# Patient Record
Sex: Female | Born: 1955 | Race: White | Hispanic: No | Smoking: Never smoker
Health system: Southern US, Community
[De-identification: ages and names within clinical notes are randomized; demographics above are authoritative.]

## PROBLEM LIST (undated history)

## (undated) DIAGNOSIS — I447 Left bundle-branch block, unspecified: Secondary | ICD-10-CM

## (undated) DIAGNOSIS — F4389 Other reactions to severe stress: Secondary | ICD-10-CM

## (undated) DIAGNOSIS — E785 Hyperlipidemia, unspecified: Secondary | ICD-10-CM

## (undated) DIAGNOSIS — F32A Depression, unspecified: Secondary | ICD-10-CM

## (undated) DIAGNOSIS — I10 Essential (primary) hypertension: Secondary | ICD-10-CM

## (undated) HISTORY — DX: Depression, unspecified: F32.A

## (undated) HISTORY — PX: NASAL SEPTUM SURGERY: SHX37

## (undated) HISTORY — DX: Other reactions to severe stress: F43.89

---

## 2003-12-28 ENCOUNTER — Ambulatory Visit: Payer: Self-pay | Admitting: Family Medicine

## 2004-03-04 ENCOUNTER — Ambulatory Visit: Payer: Self-pay | Admitting: Sports Medicine

## 2004-04-03 ENCOUNTER — Other Ambulatory Visit: Admission: RE | Admit: 2004-04-03 | Discharge: 2004-04-03 | Payer: Self-pay | Admitting: Gynecology

## 2004-11-29 ENCOUNTER — Ambulatory Visit: Payer: Self-pay | Admitting: Family Medicine

## 2004-12-17 ENCOUNTER — Ambulatory Visit: Payer: Self-pay | Admitting: Family Medicine

## 2005-05-19 ENCOUNTER — Other Ambulatory Visit: Admission: RE | Admit: 2005-05-19 | Discharge: 2005-05-19 | Payer: Self-pay | Admitting: *Deleted

## 2005-06-06 ENCOUNTER — Ambulatory Visit: Payer: Self-pay | Admitting: Family Medicine

## 2005-06-24 ENCOUNTER — Ambulatory Visit: Payer: Self-pay | Admitting: Family Medicine

## 2005-10-12 ENCOUNTER — Encounter (INDEPENDENT_AMBULATORY_CARE_PROVIDER_SITE_OTHER): Payer: Self-pay | Admitting: *Deleted

## 2005-10-12 LAB — CONVERTED CEMR LAB

## 2005-12-10 ENCOUNTER — Ambulatory Visit: Payer: Self-pay | Admitting: Family Medicine

## 2006-02-13 ENCOUNTER — Ambulatory Visit: Payer: Self-pay | Admitting: Family Medicine

## 2006-06-11 DIAGNOSIS — F329 Major depressive disorder, single episode, unspecified: Secondary | ICD-10-CM | POA: Insufficient documentation

## 2006-06-11 DIAGNOSIS — M545 Low back pain, unspecified: Secondary | ICD-10-CM | POA: Insufficient documentation

## 2006-06-11 DIAGNOSIS — N951 Menopausal and female climacteric states: Secondary | ICD-10-CM | POA: Insufficient documentation

## 2006-06-11 DIAGNOSIS — K5900 Constipation, unspecified: Secondary | ICD-10-CM | POA: Insufficient documentation

## 2006-06-11 DIAGNOSIS — F411 Generalized anxiety disorder: Secondary | ICD-10-CM | POA: Insufficient documentation

## 2006-06-12 ENCOUNTER — Encounter (INDEPENDENT_AMBULATORY_CARE_PROVIDER_SITE_OTHER): Payer: Self-pay | Admitting: *Deleted

## 2006-07-01 ENCOUNTER — Telehealth (INDEPENDENT_AMBULATORY_CARE_PROVIDER_SITE_OTHER): Payer: Self-pay | Admitting: Family Medicine

## 2007-02-02 ENCOUNTER — Encounter (INDEPENDENT_AMBULATORY_CARE_PROVIDER_SITE_OTHER): Payer: Self-pay | Admitting: Family Medicine

## 2007-03-31 ENCOUNTER — Ambulatory Visit: Payer: Self-pay | Admitting: Family Medicine

## 2007-03-31 ENCOUNTER — Encounter (INDEPENDENT_AMBULATORY_CARE_PROVIDER_SITE_OTHER): Payer: Self-pay | Admitting: Family Medicine

## 2007-03-31 DIAGNOSIS — R5383 Other fatigue: Secondary | ICD-10-CM

## 2007-03-31 DIAGNOSIS — R5381 Other malaise: Secondary | ICD-10-CM | POA: Insufficient documentation

## 2007-04-01 ENCOUNTER — Encounter (INDEPENDENT_AMBULATORY_CARE_PROVIDER_SITE_OTHER): Payer: Self-pay | Admitting: *Deleted

## 2007-04-01 LAB — CONVERTED CEMR LAB
CO2: 23 meq/L (ref 19–32)
Calcium: 9.5 mg/dL (ref 8.4–10.5)
Chloride: 106 meq/L (ref 96–112)
Glucose, Bld: 82 mg/dL (ref 70–99)
MCV: 94.1 fL (ref 78.0–100.0)
Potassium: 4.4 meq/L (ref 3.5–5.3)
RBC: 4.57 M/uL (ref 3.87–5.11)
Sodium: 140 meq/L (ref 135–145)
WBC: 5.7 10*3/uL (ref 4.0–10.5)

## 2007-04-20 ENCOUNTER — Telehealth: Payer: Self-pay | Admitting: *Deleted

## 2007-07-20 ENCOUNTER — Ambulatory Visit: Payer: Self-pay | Admitting: Family Medicine

## 2007-07-20 ENCOUNTER — Telehealth: Payer: Self-pay | Admitting: *Deleted

## 2007-07-20 DIAGNOSIS — R519 Headache, unspecified: Secondary | ICD-10-CM | POA: Insufficient documentation

## 2007-07-20 DIAGNOSIS — R51 Headache: Secondary | ICD-10-CM | POA: Insufficient documentation

## 2008-11-03 ENCOUNTER — Telehealth: Payer: Self-pay | Admitting: Family Medicine

## 2008-11-27 ENCOUNTER — Telehealth: Payer: Self-pay | Admitting: Family Medicine

## 2008-11-28 ENCOUNTER — Ambulatory Visit: Payer: Self-pay | Admitting: Family Medicine

## 2008-11-28 ENCOUNTER — Encounter: Payer: Self-pay | Admitting: Family Medicine

## 2008-11-28 DIAGNOSIS — R413 Other amnesia: Secondary | ICD-10-CM | POA: Insufficient documentation

## 2008-11-28 LAB — CONVERTED CEMR LAB
TSH: 0.738 microintl units/mL (ref 0.350–4.500)
Vitamin B-12: 509 pg/mL (ref 211–911)

## 2008-11-29 ENCOUNTER — Telehealth: Payer: Self-pay | Admitting: Family Medicine

## 2009-01-25 ENCOUNTER — Ambulatory Visit: Payer: Self-pay | Admitting: Family Medicine

## 2009-08-10 ENCOUNTER — Emergency Department: Payer: Self-pay | Admitting: Unknown Physician Specialty

## 2009-12-07 ENCOUNTER — Telehealth: Payer: Self-pay | Admitting: Family Medicine

## 2009-12-27 ENCOUNTER — Ambulatory Visit: Payer: Self-pay | Admitting: Family Medicine

## 2010-03-22 ENCOUNTER — Ambulatory Visit: Payer: Self-pay | Admitting: Family Medicine

## 2010-03-22 DIAGNOSIS — E039 Hypothyroidism, unspecified: Secondary | ICD-10-CM | POA: Insufficient documentation

## 2010-03-22 DIAGNOSIS — L259 Unspecified contact dermatitis, unspecified cause: Secondary | ICD-10-CM | POA: Insufficient documentation

## 2010-03-25 ENCOUNTER — Encounter: Payer: Self-pay | Admitting: Family Medicine

## 2010-05-14 NOTE — Progress Notes (Signed)
Summary: rx refill  Phone Note Call from Patient Call back at Work Phone [phone]   Caller: Patient Summary of Call: pt wants an Rx for Klonipin. her mother passed away a few mos ago and she has had to put her daughter in behavioral health and she has been pulling her hair out please call her back at work Initial call taken by: Loralee Pacas CMA,  December 07, 2009 9:18 AM  Follow-up for Phone Call        Please have pt schedule an office visit to discuss.  I have not seen her in nearly a year. Follow-up by: Angelena Sole MD,  December 07, 2009 9:36 AM  Additional Follow-up for Phone Call Additional follow up Details #1::        That was pts work number.  LM with secretary to have pt call back Additional Follow-up by: Jone Baseman CMA,  December 07, 2009 9:56 AM    Additional Follow-up for Phone Call Additional follow up Details #2::    she said that she can't make appt now since she has no money.  I told her that if it gets bad enough to call back and we will make appt. Follow-up by: De Nurse,  December 07, 2009 10:35 AM

## 2010-05-14 NOTE — Assessment & Plan Note (Signed)
Summary: depression and anxiety   Vital Signs:  Patient profile:   55 year old female Height:      63.5 inches Weight:      123.7 pounds BMI:     21.65 Temp:     98.8 degrees F oral Pulse rate:   74 / minute BP sitting:   121 / 68  (left arm) Cuff size:   regular  Vitals Entered By: Garen Grams LPN (December 27, 2009 2:38 PM) CC: stress Is Patient Diabetic? No Pain Assessment Patient in pain? no        Primary Care Provider:  Angelena Sole MD  CC:  stress.  History of Present Illness: 55 yo female here with itching problem.  Pt states that she has been itching her scalp much more frequently but not really from being itchy, she actually thinks it is due to her anxiety level.  Pt states that she is having a lot of problems with her daughter and has recently loss her mother and this is near the time she lost her husband in 2007.  There are a lot of family dynamics and she is having problems dealing with it.  Pt states that she has times where she feels like she just wants to cry and is overwhelming and it is interfereing with her work now.  Pt states she is not taking time for herself and not able to do the things she usually likes to do.  Pt denies SI, HI, pt denies any substance abuse problems.  Pt used to be on celexa and many other anti-depression medicines but none of them seemed to work.  Pt denies any feelings of granduosity or where she feels invincible.  No bipolar runs in the family.  Pt is not taking any medications at this time. except for some left over klonopin whcih seems to help a little.   Per the scratching pt has not tried to change shampoos no new pets nothing new in her environment, pt has had this problem before when her depression gets bad.   Habits & Providers  Alcohol-Tobacco-Diet     Tobacco Status: never  Current Medications (verified): 1)  Klonopin 0.5 Mg Tabs (Clonazepam) .... Take 1 Tablet By Mouth Three Times A Day  Allergies (verified): No  Known Drug Allergies  Past History:  Past medical, surgical, family and social histories (including risk factors) reviewed, and no changes noted (except as noted below).  Past Medical History: Reviewed history from 01/25/2009 and no changes required. Menopause 07 Depression Anxiety Fatigue  Family History: Reviewed history from 06/11/2006 and no changes required. Father-none, no psych hx, Mother- none, no psych hx, hx of polyps with colecctomy.  Social History: Reviewed history from 03/31/2007 and no changes required. 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  Was a disabled, amputee, 3 Children , daughter 33 with ADHD, daugher 66- nurse at [employer], recent questionable social hx, son 55.  Financial stress at home.; Nonsmoker, nonalcoholic, no sexual activity in 11 yrs.  Review of Systems       denies hair loss, weight changes SOb, chest pain or fatigue.   Physical Exam  General:  alert, well-developed, and well-hydrated.   Head:  scalp has some mild area of exfoliation but no real rash or dandruff seen, does not appear to have much skin breakdown and areas where there is seem self inflicted.  Eyes:  vision grossly intact, pupils equal, pupils round, and pupils reactive to light.  Mouth:  MMM Lungs:  normal respiratory effort, no crackles, and no wheezes.   Heart:  normal rate, regular rhythm, and no murmur.   Abdomen:  soft, non-tender, normal bowel sounds, and no distention.   Pulses:  2+ Extremities:  no lower extremity edema Psych:   anxious appearing, full affect, interactive,  avoids eye contact sometimes when talking about problems but not tearful.   Impression & Recommendations:  Problem # 1:  DEPRESSIVE DISORDER, NOS (ICD-311) Pt is depressed, PHQ 9 has high score, no SI, HI, pt states she would not take any medication that would cause weight gain or make her tired.  Had not tried wellbutrin so will start and titrate up after fourdays.   Pt given side effects to look for and red flags on when to stop.  Pt verbalized understanding.  Pt given Dr. Carola Rhine card and told to call if feel that this would be helpful.  Pt to follow up in 2-3 weeks with PCP.  The following medications were removed from the medication list:    Cymbalta 60 Mg Cpep (Duloxetine hcl) .Marland Kitchen... Take 1 tab daily Her updated medication list for this problem includes:    Klonopin 0.5 Mg Tabs (Clonazepam) .Marland Kitchen... Take 1 tablet by mouth three times a day  Orders: [facility]- Est  Level 4 (54270)  Problem # 2:  ANXIETY (ICD-300.00) Pt given klonopin again told that this is a band aid and would like for her to titrate down at a later date, hopefully starting in the next month, pt states up to this last week had not had any for the last month.  It shows pt can be with out it.   Pt has never flet addicted to medicine.  Plan on changing her to two times a day dosing at next visit.  The following medications were removed from the medication list:    Cymbalta 60 Mg Cpep (Duloxetine hcl) .Marland Kitchen... Take 1 tab daily Her updated medication list for this problem includes:    Klonopin 0.5 Mg Tabs (Clonazepam) .Marland Kitchen... Take 1 tablet by mouth three times a day  Orders: [facility]- Est  Level 4 (99214)  Complete Medication List: 1)  Klonopin 0.5 Mg Tabs (Clonazepam) .... Take 1 tablet by mouth three times a day 2)  Buproban 150 Mg Xr12h-tab (Bupropion hcl (smoking deter)) .Marland Kitchen.. 1 tab by mouth daily for 1st 4 days then 2 tabs by mouth once daily  Patient Instructions: 1)  Nice to meet you 2)  We will start wellbutrin today.  Take 1 tab daily for 4 days then 2 tabs once daily  3)  I will give you 3 weeks worth of klonopin to help 4)  I want you to see me or Dr. Lelon Perla in 2-3 weeks to make sure you are doing better.  5)  If you ever need anything or feel you may be a harm to yourself please call immediately.  Prescriptions: BUPROBAN 150 MG XR12H-TAB (BUPROPION HCL (SMOKING DETER)) 1 tab by mouth daily  for 1st 4 days then 2 tabs by mouth once daily  #62 x 0   Entered and Authorized by:   Antoine Primas DO   Signed by:   Antoine Primas DO on 12/27/2009   Method used:   Print then Give to Patient   RxID:   [id number] KLONOPIN 0.5 MG TABS (CLONAZEPAM) Take 1 tablet by mouth three times a day  #90 x 0   Entered and Authorized by:   Antoine Primas DO  Signed by:   Antoine Primas DO on 12/27/2009   Method used:   Print then Give to Patient   RxID:   [id number]

## 2010-05-14 NOTE — Assessment & Plan Note (Signed)
Summary: f/u,df   Vital Signs:  Patient profile:   55 year old female Height:      63.5 inches Weight:      112 pounds Temp:     98.0 degrees F oral BP sitting:   120 / 68  (left arm) Cuff size:   regular  Vitals Entered By: Tessie Fass CMA (March 22, 2010 8:52 AM) CC: hypothryoidism, menopause, depression, itchy scalp   Primary Care Provider:  Angelena Sole MD  CC:  hypothryoidism, menopause, depression, and itchy scalp.  History of Present Illness: 1. Hypothyroidism:  Pt was seen by another provider who started her on thyroid replacement to see if that would help her with her fatigue and mood.  She was started on a lose dose of Armour Thyroid.  This may have helped her with her energy a little bit but she is unsure.  She has been out of it for about 1 month and hasn't noticed much of a difference but she would like to try it again.  ROS: endorses thin hair,  denies diarrhea, constipation, endorses dry skin.  denies weight gain  2. Menopause:  Pt had her hormone levels tested by another provider that showed that she had really low Progesterone.  She was started on oral progesterone but only took it a couple of times because it was too expensive.   ROS: endorses occassional hot flashes, mood lability  3. Depression / Anxiety:  She has been tried on different anti-depressants and some natural alternatives like Deplin but didn't seem to make much of a difference.  She seems to have constant stress with her family.  She had to admit her daughter to behavioural health.  She feels that her mood is fairly stable right now but she would like to feel better.  Her anxiety is fairly well controlled but she is also having to take Klonopin on occassion when she gets really anxious.  She still has some of those left.  ROS: denies SI, manic symptoms  4. Itchy scalp:  She has had itchy scalp for months.  Seems to be worse in the winter months.  She has tried different shampoos without much  benefit.  ROS: denies hair loss  Current Medications (verified): 1)  Klonopin 0.5 Mg Tabs (Clonazepam) .... Take 1 Tablet By Mouth Three Times A Day 2)  Armour Thyroid 15 Mg Tabs (Thyroid) .... Take 1 Tab By Mouth Daily 3)  Hrt Base Natural  Crea (Hormone Cream Base) .... Progesterone Cream.  Apply As Directed 4)  Fish Oil 1000 Mg Caps (Omega-3 Fatty Acids) .... 2 Tabs By Mouth Daily  Allergies: No Known Drug Allergies  Past History:  Past Medical History: Reviewed history from 01/25/2009 and no changes required. Menopause 07 Depression Anxiety Fatigue  Social History: Reviewed history from 03/31/2007 and no changes required. 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  Was a disabled, amputee, 3 Children , daughter 49 with ADHD, daugher 75- nurse at [employer], recent questionable social hx, son 29.  Financial stress at home.; Nonsmoker, nonalcoholic, no sexual activity in 11 yrs.  Physical Exam  General:  vitals reviewed. alert, well-developed, and well-hydrated.  no acute distress. Head:  scalp dry with some areas of excoriation Eyes:  vision grossly intact.  no exopthalmos Mouth:  good dentition and pharynx pink and moist.   Neck:  supple, full ROM, and no masses.  no thyroidmegaly Lungs:  normal respiratory effort, normal breath sounds, no crackles,  and no wheezes.   Heart:  normal rate, regular rhythm, and no murmur.   Abdomen:  soft, non-tender, and normal bowel sounds.   Msk:  no joint tenderness and no joint swelling.   Extremities:  no LE edema Skin:  slightly dry skin Psych:  normally interactive, good eye contact, not anxious appearing, and not depressed appearing.   Additional Exam:  Labs reviewed from other provider: 1. TSH 1.97, T3 3.40, T4 0.9, RT3 22.5 2. Progesterone <0.03 3. Cortisol: overall normal curve 4. EFA: low Omega 3's 5. Micronutrients: multiple low   Impression & Recommendations:  Problem # 1:  HYPOTHYROIDISM,  BORDERLINE (ICD-244.9) Assessment New  Pt with slightly elevated TSH and low normal T3 and T4.  Will try Armour thyroid at low dose to see if that helps her with her energy.  Will recheck TSH, T3, T4 at next visit. Her updated medication list for this problem includes:    Armour Thyroid 15 Mg Tabs (Thyroid) .Marland Kitchen... Take 1 tab by mouth daily  Orders: [facility]- Est  Level 4 (59563)  Problem # 2:  MENOPAUSAL SYNDROME (ICD-627.2) Assessment: Unchanged  Still with some symptoms.  Labs indicate very low progesteron.  Recommended OTC progesterone cream.  Orders: [facility]- Est  Level 4 (87564)  Problem # 3:  DEPRESSIVE DISORDER, NOS (ICD-311) Assessment: Unchanged  Mood stable.  Will see how she does with some thyroid and progesterone.  Continue to monitor. Her updated medication list for this problem includes:    Klonopin 0.5 Mg Tabs (Clonazepam) .Marland Kitchen... Take 1 tablet by mouth three times a day  Orders: [facility]- Est  Level 4 (33295)  Problem # 4:  ANXIETY (ICD-300.00) Assessment: Unchanged  Mood stable.  Will see how she does with the progesterone.  Continue to monitor. Her updated medication list for this problem includes:    Klonopin 0.5 Mg Tabs (Clonazepam) .Marland Kitchen... Take 1 tablet by mouth three times a day  Orders: [facility]- Est  Level 4 (18841)  Problem # 5:  DERMATITIS, SCALP (ICD-692.9) Assessment: New  Looks like dry skin.  Will try Hydrocortisone.  Also advised her to start taking Fish Oil.  Her updated medication list for this problem includes:    Ala Scalp 2 % Lotn (Hydrocortisone) .Marland Kitchen... Apply small amount to itchy scalp twice a day  Orders: [facility]- Est  Level 4 (99214)  Complete Medication List: 1)  Klonopin 0.5 Mg Tabs (Clonazepam) .... Take 1 tablet by mouth three times a day 2)  Armour Thyroid 15 Mg Tabs (Thyroid) .... Take 1 tab by mouth daily 3)  Hrt Base Natural Crea (Hormone cream base) .... Progesterone cream.  apply as directed 4)  Fish Oil 1000 Mg Caps (Omega-3 fatty acids) ....  2 tabs by mouth daily 5)  Ala Scalp 2 % Lotn (Hydrocortisone) .... Apply small amount to itchy scalp twice a day  Patient Instructions: 1)  We will continue the Armour thyroid at a low dose to see if that helps 2)  I also want you to start taking Progesteron cream that you can get over the counter 3)  Start taking Fish oil 4)  We will try Hydrocortisone for your scalp 5)  Please schedule a follow up appointment in 2 months and we will recheck your thyroid levels Prescriptions: ALA SCALP 2 % LOTN (HYDROCORTISONE) Apply small amount to itchy scalp twice a day  #1 x 3   Entered and Authorized by:   Angelena Sole MD   Signed by:   Angelena Sole  MD on 03/22/2010   Method used:   Electronically to        [facility] (retail)       [address]       [place], [place]  [address]       Ph: [phone] or [phone]       Fax: [phone]   RxID:   [id number] HRT BASE NATURAL  CREA (HORMONE CREAM BASE) Progesterone cream.  Apply as directed  #1 x 3   Entered and Authorized by:   Angelena Sole MD   Signed by:   Angelena Sole MD on 03/22/2010   Method used:   [facility]        [id number] (retail[facility]       [address]       [place], [place]  [address]       Ph: [phone] or [phone]       Fax: [phone]   RxID:   [id number] ARMOUR THYROID 15 MG TABS (THYROID) Take 1 tab by mouth daily  #30 x 3   Entered and Authorized by:   Angelena Sole MD   Signed by:   Angelena Sole MD on 03/22/2010   Method used:   [facility]        [id number]       [address]       [place], [place]  [address]       Ph: [phone] or [phone]       Fax: [phone]   RxID:   [id number]    Orders Added: 1)  [facility]- Est  Level 4 [93810]

## 2010-05-16 NOTE — Miscellaneous (Signed)
Summary: HPA results  Clinical Lists Changes  [facility] HPA results Date 05/27/09  Cortisol: -am: 7.0 -noon: 3.2 -afternoon: 2.9 (H) -evening: 1.2  DHEAs -am: 1.2 -pm: 0.9 (L)  Serotonin: 140.6 (L) GABA: 184.6 (L) Dopamine: 198.8 NE: 36.3 Epi: 5.0 Glutamate: 31.7  N/E ratio: 7.2

## 2010-06-26 ENCOUNTER — Ambulatory Visit (INDEPENDENT_AMBULATORY_CARE_PROVIDER_SITE_OTHER): Payer: PRIVATE HEALTH INSURANCE | Admitting: Family Medicine

## 2010-06-26 ENCOUNTER — Encounter: Payer: Self-pay | Admitting: Family Medicine

## 2010-06-26 VITALS — BP 139/82 | HR 87 | Temp 98.8°F | Wt 104.2 lb

## 2010-06-26 DIAGNOSIS — D1739 Benign lipomatous neoplasm of skin and subcutaneous tissue of other sites: Secondary | ICD-10-CM

## 2010-06-26 DIAGNOSIS — E039 Hypothyroidism, unspecified: Secondary | ICD-10-CM

## 2010-06-26 DIAGNOSIS — D170 Benign lipomatous neoplasm of skin and subcutaneous tissue of head, face and neck: Secondary | ICD-10-CM | POA: Insufficient documentation

## 2010-06-26 DIAGNOSIS — J329 Chronic sinusitis, unspecified: Secondary | ICD-10-CM

## 2010-06-26 LAB — TSH: TSH: 0.944 u[IU]/mL (ref 0.350–4.500)

## 2010-06-26 MED ORDER — AMOXICILLIN 500 MG PO CAPS: 500.0000 mg | ORAL_CAPSULE | Freq: Three times a day (TID) | ORAL | Status: AC

## 2010-06-26 NOTE — Assessment & Plan Note (Signed)
Sounds like chronic sinusitis.  She has a hx of sinus infections and has required abx before.  Will treat with 10 day course of Amoxicillin.  No signs of serious infection.  No fevers

## 2010-06-26 NOTE — Assessment & Plan Note (Signed)
Small lipoma, should be easy to remove.  Will have her schedule a procedure visit.

## 2010-06-26 NOTE — Progress Notes (Signed)
  Subjective:    Patient ID: Kristi Clay, female    DOB: 1955-09-05, 55 y.o.   MRN: [id number]  HPI 1. Sinusitis:  She has had sinusitis type symptoms for the past couple of months.  It started with viral URI with cough, congestion.  The cough improved but the sinus congestion has persisted.  She continues to have rhinnorhea.  She has also started to have headaches.  2. Hypothyroid:  She has been taking the Armour as directed.  She hasn't noticed much of a difference in her energy level.  3. Bump on her neck:  She noticed a small bump on the back of her neck a couple of days ago.  It is not tender, red, or swollen.  It is not getting bigger or smaller.   Review of Systems  Constitutional: Positive for fatigue. Negative for fever, activity change and unexpected weight change.  HENT: Positive for congestion and rhinorrhea. Negative for ear pain, sneezing, neck pain, neck stiffness and postnasal drip.   Eyes: Negative for pain.  Respiratory: Positive for cough. Negative for shortness of breath, wheezing and stridor.   Cardiovascular: Negative for chest pain, palpitations and leg swelling.  Gastrointestinal: Negative for abdominal distention.  Neurological: Positive for headaches. Negative for weakness, light-headedness and numbness.  Hematological: Negative for adenopathy.       Objective:   Physical Exam  Constitutional: She appears well-nourished. No distress.  HENT:  Head: Normocephalic and atraumatic.       TMs normal bilaterally OP pink and moist, no exudate Sinuses non-tender + rhinorrhea  Neck: Normal range of motion. Neck supple. No thyromegaly present.       Small (<1cm) lipoma on the back of her neck  Cardiovascular: Normal rate and regular rhythm.   Pulmonary/Chest: Effort normal and breath sounds normal. No respiratory distress. She has no wheezes. She has no rales.  Abdominal: Soft.  Musculoskeletal: She exhibits no edema.  Skin: Skin is warm and dry. No rash  noted. She is not diaphoretic. No erythema.          Assessment & Plan:

## 2010-06-26 NOTE — Patient Instructions (Signed)
It was good to see you today I think that you have a persistent sinusitis I am going to treat you with a 10 day course of Amoxicillin We will check your thyroid function today and will let you know of the results I think that the bump on your neck is a small lipoma.  Please schedule an appointment that is convenient to take it out.

## 2010-06-26 NOTE — Assessment & Plan Note (Signed)
She hasn't noticed much of a difference in her energy.  Will check TSH again today.  If she can tolerate it, may consider increasing the dose slightly.

## 2010-06-27 ENCOUNTER — Encounter: Payer: Self-pay | Admitting: Family Medicine

## 2010-06-27 ENCOUNTER — Other Ambulatory Visit: Payer: Self-pay | Admitting: Family Medicine

## 2010-07-16 ENCOUNTER — Telehealth: Payer: Self-pay | Admitting: Family Medicine

## 2010-07-16 NOTE — Telephone Encounter (Signed)
She is worse than before last visit. Told her to stop the Afrin. Told her it can have a rebound effect. She will make an appt to be seen again

## 2010-07-16 NOTE — Telephone Encounter (Signed)
Patient calling back to say she is more congested than when she came for appt.  Antibiotic helped some for infection and headaches, but need something else called in for nasal congestion.  Using Afrin, but may have used longer than prescribed,so now need extra help for that.  Please advise if nothing can be called to what can be done for relief.

## 2010-07-17 ENCOUNTER — Encounter: Payer: Self-pay | Admitting: Family Medicine

## 2010-07-17 ENCOUNTER — Ambulatory Visit (INDEPENDENT_AMBULATORY_CARE_PROVIDER_SITE_OTHER): Payer: PRIVATE HEALTH INSURANCE | Admitting: Family Medicine

## 2010-07-17 VITALS — BP 138/80 | HR 70 | Temp 97.6°F | Wt 107.0 lb

## 2010-07-17 DIAGNOSIS — J329 Chronic sinusitis, unspecified: Secondary | ICD-10-CM

## 2010-07-17 MED ORDER — FLUTICASONE PROPIONATE 50 MCG/ACT NA SUSP: 2.0000 | Freq: Every day | NASAL | Status: DC

## 2010-07-17 NOTE — Progress Notes (Signed)
  Subjective:    Patient ID: Kristi Clay, female    DOB: 01-23-1956, 55 y.o.   MRN: [id number]  HPI  1) Nasal congestion / rhinorrhea: Seen on 06/26/10 by Dr. Lelon Perla, diagnosed with sinusitis, started on amoxicillin. She has completed the course of antibiotics and reports that sinus pressure has improved, but she has continued to have rhinorrhea and congestion and mild cough. Denies fever, chills, nausea, emesis, diarrhea, rash, wheeze, dyspnea, chest pain, itchy eyes, ear pain, fatigue, headache.   Review of Systems As per HPI otherwise negative    Objective:   Physical Exam General: Well appearing, NAD  Head: No sinus tenderness to palpation Ears: TM normal bilaterally Eyes: No conjunctival injection or tearing  Nose: +ve congestion and rhinorrhea, pale turbinates  Mouth: no erythema, or cobblestoning or exudate  Neck: no LAD  Pulmonary/Chest: Effort normal and breath sounds normal. No respiratory distress. She has no wheezes. She has no rales.         Assessment & Plan:

## 2010-07-17 NOTE — Assessment & Plan Note (Signed)
Possibly allergic rhinitis though patient denies history of allergies. Will try Flonase today, advised regarding non-sedating OTC anti-histamines. Follow up prn. Handout given.

## 2010-07-17 NOTE — Patient Instructions (Signed)
Stop using the Afrin - this may be contributing to your symptoms. Start using Flonase as directed. You may also try over the counter Zyrtec, Allegra or Claritin to help with symptoms.  Follow up with Dr. Lelon Perla as needed.  Have a great day!  - Dr. Wallene Huh     Sinusitis Sinuses are air pockets within the bones of your face. The growth of bacteria within a sinus leads to infection. Infection keeps the sinuses from draining. This infection is called sinusitis. SYMPTOMS There will be different areas of pain depending on which sinuses have become infected.  The maxillary sinuses often produce pain beneath the eyes.   Frontal sinusitis may cause pain in the middle of the forehead and above the eyes.  Other problems (symptoms) include:  Toothaches.   Colored, pus-like (purulent) drainage from the nose.   Any swelling, warmth, or tenderness over the sinus areas may be signs of infection.  TREATMENT Sinusitis is most often determined by an exam and you may have x-rays taken. If x-rays have been taken, make sure you obtain your results. Or find out how you are to obtain them. Your caregiver may give you medications (antibiotics). These are medications that will help kill the infection. You may also be given a medication (decongestant) that helps to reduce sinus swelling.  HOME CARE INSTRUCTIONS  Only take over-the-counter or prescription medicines for pain, discomfort, or fever as directed by your caregiver.   Drink extra fluids. Fluids help thin the mucus so your sinuses can drain more easily.   Applying either moist heat or ice packs to the sinus areas may help relieve discomfort.   Use saline nasal sprays to help moisten your sinuses. The sprays can be found at your local drugstore.  SEEK IMMEDIATE MEDICAL CARE IF YOU DEVELOP:  High fever that is still present after two days of antibiotic treatment.   Increasing pain, severe headaches, or toothache.   Nausea, vomiting, or  drowsiness.   Unusual swelling around the face or trouble seeing.  MAKE SURE YOU:   Understand these instructions.   Will watch your condition.   Will get help right away if you are not doing well or get worse.  Document Released: 03/31/2005 Document Re-Released: 03/13/2008 [facility] Patient Information 2011 [place], [place].

## 2010-07-27 ENCOUNTER — Other Ambulatory Visit: Payer: Self-pay | Admitting: Family Medicine

## 2010-07-29 NOTE — Telephone Encounter (Signed)
Refill request

## 2010-11-30 ENCOUNTER — Other Ambulatory Visit: Payer: Self-pay | Admitting: Family Medicine

## 2010-12-01 NOTE — Telephone Encounter (Signed)
Refill request

## 2010-12-31 ENCOUNTER — Other Ambulatory Visit: Payer: Self-pay | Admitting: Family Medicine

## 2011-01-01 NOTE — Telephone Encounter (Signed)
Refill request

## 2011-12-11 ENCOUNTER — Other Ambulatory Visit: Payer: Self-pay | Admitting: Family Medicine

## 2012-02-10 ENCOUNTER — Other Ambulatory Visit: Payer: Self-pay | Admitting: Family Medicine

## 2012-07-31 ENCOUNTER — Other Ambulatory Visit: Payer: Self-pay | Admitting: Family Medicine

## 2014-01-26 ENCOUNTER — Ambulatory Visit: Payer: Self-pay | Admitting: Gastroenterology

## 2015-11-22 ENCOUNTER — Encounter (HOSPITAL_COMMUNITY): Payer: Self-pay | Admitting: Emergency Medicine

## 2015-11-22 ENCOUNTER — Ambulatory Visit (HOSPITAL_COMMUNITY)
Admission: EM | Admit: 2015-11-22 | Discharge: 2015-11-22 | Disposition: A | Discharge status: Home / Self Care | Payer: BLUE CROSS/BLUE SHIELD | Attending: Family Medicine | Admitting: Family Medicine

## 2015-11-22 DIAGNOSIS — N390 Urinary tract infection, site not specified: Secondary | ICD-10-CM | POA: Diagnosis not present

## 2015-11-22 LAB — POCT URINALYSIS DIP (DEVICE)
Glucose, UA: NEGATIVE mg/dL
Ketones, ur: 15 mg/dL — AB
Leukocytes, UA: NEGATIVE
NITRITE: NEGATIVE
PH: 5.5 (ref 5.0–8.0)
Protein, ur: 30 mg/dL — AB
UROBILINOGEN UA: 1 mg/dL (ref 0.0–1.0)

## 2015-11-22 MED ORDER — CEPHALEXIN 500 MG PO CAPS: 500.0000 mg | ORAL_CAPSULE | Freq: Four times a day (QID) | ORAL | 0 refills | Status: DC

## 2015-11-22 NOTE — ED Provider Notes (Signed)
CSN: [id number]     Arrival date & time 11/22/15  1420 History   First MD Initiated Contact with Patient 11/22/15 1514     Chief Complaint  Patient presents with  . Urinary Tract Infection   (Consider location/radiation/quality/duration/timing/severity/associated sxs/prior Treatment) 60 year old female is complaining of urinary frequency, urgency and bladder discomfort for the past 24 hours. Symptoms are progressing. Denies actual pain with urination. Denies nausea, vomiting, fever or chills.      History reviewed. No pertinent past medical history. Past Surgical History:  Procedure Laterality Date  . NASAL SEPTUM SURGERY     No family history on file. Social History  Substance Use Topics  . Smoking status: Never Smoker  . Smokeless tobacco: Never Used  . Alcohol use Not on file   OB History    No data available     Review of Systems  Constitutional: Negative.   HENT: Negative.   Respiratory: Negative.   Gastrointestinal: Negative.   Genitourinary: Positive for frequency and urgency. Negative for dysuria and menstrual problem.       Small volume voids  All other systems reviewed and are negative.   Allergies  Review of patient's allergies indicates no known allergies.  Home Medications   Prior to Admission medications   Medication Sig Start Date End Date Taking? Authorizing Provider  ARMOUR THYROID 15 MG tablet TAKE 1 TABLET BY MOUTH ONCE DAILY 07/31/12   Boykin Nearing, MD  cephALEXin (KEFLEX) 500 MG capsule Take 1 capsule (500 mg total) by mouth 4 (four) times daily. 11/22/15   Janne Napoleon, NP  clonazePAM (KLONOPIN) 0.5 MG tablet Take 0.5 mg by mouth 3 (three) times daily as needed.      Historical Provider, MD  fish oil-omega-3 fatty acids 1000 MG capsule Take 2 g by mouth daily.      Historical Provider, MD  fluticasone (FLONASE) 50 MCG/ACT nasal spray 2 sprays by Nasal route daily. 07/17/10 07/17/11  Mariana Arn, MD  HYDROCORTISONE, TOPICAL, (ALA-SCALP) 2 % LOTN  Apply small amount to itchy scalp twice a day     Historical Provider, MD   Meds Ordered and Administered this Visit  Medications - No data to display  BP 146/86 (BP Location: Right Arm)   Pulse 92   Temp 98.9 F (37.2 C) (Oral)   Resp 16   SpO2 100%  No data found.   Physical Exam  Constitutional: She is oriented to person, place, and time. She appears well-developed and well-nourished. No distress.  Eyes: EOM are normal.  Neck: Normal range of motion. Neck supple.  Cardiovascular: Normal rate.   Pulmonary/Chest: Effort normal. No respiratory distress.  Musculoskeletal: She exhibits no edema.  Neurological: She is alert and oriented to person, place, and time. She exhibits normal muscle tone.  Skin: Skin is warm and dry.  Psychiatric: She has a normal mood and affect.  Nursing note and vitals reviewed.   Urgent Care Course   Clinical Course    Procedures (including critical care time)  Labs Review Labs Reviewed  POCT URINALYSIS DIP (DEVICE) - Abnormal; Notable for the following:       Result Value   Bilirubin Urine SMALL (*)    Ketones, ur 15 (*)    Hgb urine dipstick MODERATE (*)    Protein, ur 30 (*)    All other components within normal limits  URINE CULTURE   Results for orders placed or performed during the hospital encounter of 11/22/15  POCT urinalysis dip (device)  Result  Value Ref Range   Glucose, UA NEGATIVE NEGATIVE mg/dL   Bilirubin Urine SMALL (A) NEGATIVE   Ketones, ur 15 (A) NEGATIVE mg/dL   Specific Gravity, Urine >=1.030 1.005 - 1.030   Hgb urine dipstick MODERATE (A) NEGATIVE   pH 5.5 5.0 - 8.0   Protein, ur 30 (A) NEGATIVE mg/dL   Urobilinogen, UA 1.0 0.0 - 1.0 mg/dL   Nitrite NEGATIVE NEGATIVE   Leukocytes, UA NEGATIVE NEGATIVE     Imaging Review No results found.   Visual Acuity Review  Right Eye Distance:   Left Eye Distance:   Bilateral Distance:    Right Eye Near:   Left Eye Near:    Bilateral Near:         MDM    1. UTI (lower urinary tract infection)    Meds ordered this encounter  Medications  . cephALEXin (KEFLEX) 500 MG capsule    Sig: Take 1 capsule (500 mg total) by mouth 4 (four) times daily.    Dispense:  28 capsule    Refill:  0    Order Specific Question:   Supervising Provider    Answer:   Melony Overly [id number]   AZO standard as dir Plenty of water Urine culture pending.    Janne Napoleon, NP 11/22/15 1531

## 2015-11-22 NOTE — ED Notes (Signed)
Patient requested script to be called to a different pharmacy since she is still at work and not going home until after office hours.  Called in cephalexin to [facility].  Spoke to [employer] at this location.

## 2015-11-22 NOTE — ED Triage Notes (Signed)
Patient reports a 2 day history of frequent urination, burning and bladder pressure.  Denies fever, no new back pain

## 2015-11-23 LAB — URINE CULTURE: Culture: NO GROWTH

## 2017-05-08 DIAGNOSIS — H2513 Age-related nuclear cataract, bilateral: Secondary | ICD-10-CM | POA: Diagnosis not present

## 2017-05-22 DIAGNOSIS — H2511 Age-related nuclear cataract, right eye: Secondary | ICD-10-CM | POA: Diagnosis not present

## 2017-05-22 DIAGNOSIS — H25811 Combined forms of age-related cataract, right eye: Secondary | ICD-10-CM | POA: Diagnosis not present

## 2017-05-22 DIAGNOSIS — H52201 Unspecified astigmatism, right eye: Secondary | ICD-10-CM | POA: Diagnosis not present

## 2017-06-05 DIAGNOSIS — H2512 Age-related nuclear cataract, left eye: Secondary | ICD-10-CM | POA: Diagnosis not present

## 2017-06-05 DIAGNOSIS — H25812 Combined forms of age-related cataract, left eye: Secondary | ICD-10-CM | POA: Diagnosis not present

## 2017-06-05 DIAGNOSIS — H52202 Unspecified astigmatism, left eye: Secondary | ICD-10-CM | POA: Diagnosis not present

## 2017-06-25 DIAGNOSIS — H02413 Mechanical ptosis of bilateral eyelids: Secondary | ICD-10-CM | POA: Diagnosis not present

## 2017-06-29 DIAGNOSIS — H02413 Mechanical ptosis of bilateral eyelids: Secondary | ICD-10-CM | POA: Diagnosis not present

## 2017-07-17 DIAGNOSIS — Z1151 Encounter for screening for human papillomavirus (HPV): Secondary | ICD-10-CM | POA: Diagnosis not present

## 2017-07-17 DIAGNOSIS — Z01419 Encounter for gynecological examination (general) (routine) without abnormal findings: Secondary | ICD-10-CM | POA: Diagnosis not present

## 2017-12-03 DIAGNOSIS — I10 Essential (primary) hypertension: Secondary | ICD-10-CM | POA: Diagnosis not present

## 2019-01-08 ENCOUNTER — Emergency Department
Admission: EM | Admit: 2019-01-08 | Discharge: 2019-01-08 | Disposition: A | Discharge status: Home / Self Care | Payer: 59 | Attending: Emergency Medicine | Admitting: Emergency Medicine

## 2019-01-08 ENCOUNTER — Other Ambulatory Visit: Payer: Self-pay

## 2019-01-08 ENCOUNTER — Emergency Department: Payer: 59

## 2019-01-08 DIAGNOSIS — I10 Essential (primary) hypertension: Secondary | ICD-10-CM | POA: Diagnosis not present

## 2019-01-08 DIAGNOSIS — M79672 Pain in left foot: Secondary | ICD-10-CM | POA: Insufficient documentation

## 2019-01-08 HISTORY — DX: Essential (primary) hypertension: I10

## 2019-01-08 MED ORDER — PREDNISONE 50 MG PO TABS: ORAL_TABLET | ORAL | 0 refills | Status: DC

## 2019-01-08 MED ORDER — KETOROLAC TROMETHAMINE 30 MG/ML IJ SOLN
30.0000 mg | Freq: Once | INTRAMUSCULAR | Status: AC
  Administered 2019-01-08: 30 mg via INTRAMUSCULAR
  Filled 2019-01-08: qty 1

## 2019-01-08 NOTE — ED Provider Notes (Signed)
[facility] Emergency Department Provider Note  ____________________________________________  Time seen: Approximately 11:27 PM  I have reviewed the triage vital signs and the nursing notes.   HISTORY  Chief Complaint Foot Pain    HPI Kristi Clay is a 63 y.o. female presents to the emergency department  with acute aching foot pain across the dorsal aspect of the left foot.  Patient states that she typically wears a shoe with a narrow toe box.  She localizes pain primarily between first and second metatarsals.  She denies numbness or tingling of the left foot.  She has not noticed any erythema.  No prior history of gout.  No pain in the left calf.  Patient states that she took some leftover prednisone that was from a prescription for her daughter and states that pain improved significantly.  No other alleviating measures have been attempted.        Past Medical History:  Diagnosis Date  . Hypertension     Patient Active Problem List   Diagnosis Date Noted  . Sinusitis 06/26/2010  . Lipoma of neck 06/26/2010  . HYPOTHYROIDISM, BORDERLINE 03/22/2010  . DERMATITIS, SCALP 03/22/2010  . MEMORY LOSS 11/28/2008  . HEADACHE 07/20/2007  . FATIGUE 03/31/2007  . ANXIETY 06/11/2006  . DEPRESSIVE DISORDER, NOS 06/11/2006  . CONSTIPATION 06/11/2006  . MENOPAUSAL SYNDROME 06/11/2006  . BACK PAIN, LOW 06/11/2006    Past Surgical History:  Procedure Laterality Date  . NASAL SEPTUM SURGERY      Prior to Admission medications   Medication Sig Start Date End Date Taking? Authorizing Provider  ARMOUR THYROID 15 MG tablet TAKE 1 TABLET BY MOUTH ONCE DAILY 07/31/12   Funches, Adriana Mccallum, MD  cephALEXin (KEFLEX) 500 MG capsule Take 1 capsule (500 mg total) by mouth 4 (four) times daily. 11/22/15   Janne Napoleon, NP  clonazePAM (KLONOPIN) 0.5 MG tablet Take 0.5 mg by mouth 3 (three) times daily as needed.      [provider]  fish oil-omega-3 fatty acids 1000  MG capsule Take 2 g by mouth daily.      [provider]  fluticasone (FLONASE) 50 MCG/ACT nasal spray 2 sprays by Nasal route daily. 07/17/10 07/17/11  Mariana Arn, MD  HYDROCORTISONE, TOPICAL, (ALA-SCALP) 2 % LOTN Apply small amount to itchy scalp twice a day     [provider]  predniSONE (DELTASONE) 50 MG tablet Take one 50 mg tablet once daily for the next five days. 01/08/19   Lannie Fields, PA-C    Allergies Patient has no known allergies.  No family history on file.  Social History Social History   Tobacco Use  . Smoking status: Never Smoker  . Smokeless tobacco: Never Used  Substance Use Topics  . Alcohol use: Not on file  . Drug use: No     Review of Systems  Constitutional: No fever/chills Eyes: No visual changes. No discharge ENT: No upper respiratory complaints. Cardiovascular: no chest pain. Respiratory: no cough. No SOB. Gastrointestinal: No abdominal pain.  No nausea, no vomiting.  No diarrhea.  No constipation. Musculoskeletal: Patient has left foot pain.  Skin: Negative for rash, abrasions, lacerations, ecchymosis. Neurological: Negative for headaches, focal weakness or numbness.   ____________________________________________   PHYSICAL EXAM:  VITAL SIGNS: ED Triage Vitals  Enc Vitals Group     BP 01/08/19 2251 134/88     Pulse Rate 01/08/19 2251 83     Resp 01/08/19 2251 17     Temp 01/08/19  2251 98.5 F (36.9 C)     Temp Source 01/08/19 2251 Oral     SpO2 01/08/19 2251 99 %     Weight 01/08/19 2248 158 lb (71.7 kg)     Height 01/08/19 2248 5\' 3"  (1.6 m)     Head Circumference --      Peak Flow --      Pain Score 01/08/19 2248 10     Pain Loc --      Pain Edu? --      Excl. in [place]? --      Constitutional: Alert and oriented. Well appearing and in no acute distress. Eyes: Conjunctivae are normal. PERRL. EOMI. Head: Atraumatic. Cardiovascular: Normal rate, regular rhythm. Normal S1 and S2.  Good peripheral  circulation. Respiratory: Normal respiratory effort without tachypnea or retractions. Lungs CTAB. Good air entry to the bases with no decreased or absent breath sounds. Gastrointestinal: Bowel sounds 4 quadrants. Soft and nontender to palpation. No guarding or rigidity. No palpable masses. No distention. No CVA tenderness. Musculoskeletal: Patient has pain to palpation along the dorsal aspect of the left foot has a positive squeeze test.  Palpable dorsalis pedis pulse, left.  Capillary refill less than 2 seconds on the left. Neurologic:  Normal speech and language. No gross focal neurologic deficits are appreciated.  Skin:  Skin is warm, dry and intact. No rash noted. Psychiatric: Mood and affect are normal. Speech and behavior are normal. Patient exhibits appropriate insight and judgement.   ____________________________________________   LABS (all labs ordered are listed, but only abnormal results are displayed)  Labs Reviewed - No data to display ____________________________________________  EKG   ____________________________________________  RADIOLOGY No bony abnormality visualized.  No results found.  ____________________________________________    PROCEDURES  Procedure(s) performed:    Procedures    Medications  ketorolac (TORADOL) 30 MG/ML injection 30 mg (has no administration in time range)     ____________________________________________   INITIAL IMPRESSION / ASSESSMENT AND PLAN / ED COURSE  Pertinent labs & imaging results that were available during my care of the patient were reviewed by me and considered in my medical decision making (see chart for details).  Review of the [place] CSRS was performed in accordance of the [place] prior to dispensing any controlled drugs.           Assessment and plan Foot pain 63 year old female presents to the emergency department with pain along the dorsal aspect of her foot that is worsened with palpation.  On  physical exam, patient had a positive squeeze test increasing suspicion for neuroma.  Advised patient to wear a wide toe box shoe in the future.  Patient was started on a burst of prednisone and was given an injection of Toradol in the emergency department.  She was advised to follow-up with podiatry as needed.  All patient questions were answered.    ____________________________________________  FINAL CLINICAL IMPRESSION(S) / ED DIAGNOSES  Final diagnoses:  Left foot pain      NEW MEDICATIONS STARTED DURING THIS VISIT:  ED Discharge Orders         Ordered    predniSONE (DELTASONE) 50 MG tablet     01/08/19 2323              This chart was dictated using voice recognition software/Dragon. Despite best efforts to proofread, errors can occur which can change the meaning. Any change was purely unintentional.    Lannie Fields, PA-C 01/08/19 2330    Lenise Arena  E, MD 01/13/19 [phone]

## 2019-01-08 NOTE — ED Triage Notes (Signed)
Pt arrives to ED via POV from home with c/o left foot x1 week. Pt denies any known injury or trauma. Pt states she walks a lot at work and "you know when your shoes sqeeak when you stop or turn real fast?" is when the pain initially started. Pt reports taking Ibuprofen without relief; states she took "some my daughter's Prednisone which helped a little". Pt arrives wearing a boot that her son gave her. Pt denies h/x of being diabetic. Pt is A&O, in NAD; RR even, regular, and unlabored.

## 2019-07-05 ENCOUNTER — Emergency Department (HOSPITAL_COMMUNITY): Payer: BC Managed Care – PPO

## 2019-07-05 ENCOUNTER — Encounter (HOSPITAL_COMMUNITY): Payer: Self-pay | Admitting: Emergency Medicine

## 2019-07-05 ENCOUNTER — Other Ambulatory Visit: Payer: Self-pay

## 2019-07-05 ENCOUNTER — Emergency Department (HOSPITAL_COMMUNITY)
Admission: EM | Admit: 2019-07-05 | Discharge: 2019-07-06 | Disposition: A | Discharge status: Home / Self Care | Payer: BC Managed Care – PPO | Attending: Emergency Medicine | Admitting: Emergency Medicine

## 2019-07-05 DIAGNOSIS — E7800 Pure hypercholesterolemia, unspecified: Secondary | ICD-10-CM | POA: Diagnosis not present

## 2019-07-05 DIAGNOSIS — I10 Essential (primary) hypertension: Secondary | ICD-10-CM | POA: Insufficient documentation

## 2019-07-05 DIAGNOSIS — R519 Headache, unspecified: Secondary | ICD-10-CM | POA: Insufficient documentation

## 2019-07-05 DIAGNOSIS — R0789 Other chest pain: Secondary | ICD-10-CM | POA: Diagnosis not present

## 2019-07-05 DIAGNOSIS — R079 Chest pain, unspecified: Secondary | ICD-10-CM | POA: Diagnosis not present

## 2019-07-05 DIAGNOSIS — Z79899 Other long term (current) drug therapy: Secondary | ICD-10-CM | POA: Insufficient documentation

## 2019-07-05 DIAGNOSIS — R072 Precordial pain: Secondary | ICD-10-CM | POA: Diagnosis not present

## 2019-07-05 DIAGNOSIS — I447 Left bundle-branch block, unspecified: Secondary | ICD-10-CM | POA: Diagnosis not present

## 2019-07-05 DIAGNOSIS — Z8249 Family history of ischemic heart disease and other diseases of the circulatory system: Secondary | ICD-10-CM | POA: Diagnosis not present

## 2019-07-05 DIAGNOSIS — R0602 Shortness of breath: Secondary | ICD-10-CM | POA: Diagnosis not present

## 2019-07-05 HISTORY — DX: Left bundle-branch block, unspecified: I44.7

## 2019-07-05 LAB — CBC
HCT: 42.8 % (ref 36.0–46.0)
Hemoglobin: 13.8 g/dL (ref 12.0–15.0)
MCH: 30.3 pg (ref 26.0–34.0)
MCHC: 32.2 g/dL (ref 30.0–36.0)
MCV: 93.9 fL (ref 80.0–100.0)
Platelets: 276 10*3/uL (ref 150–400)
RBC: 4.56 MIL/uL (ref 3.87–5.11)
RDW: 13.1 % (ref 11.5–15.5)
WBC: 7.3 10*3/uL (ref 4.0–10.5)
nRBC: 0 % (ref 0.0–0.2)

## 2019-07-05 LAB — BASIC METABOLIC PANEL
Anion gap: 16 — ABNORMAL HIGH (ref 5–15)
BUN: 21 mg/dL (ref 8–23)
CO2: 22 mmol/L (ref 22–32)
Calcium: 9.4 mg/dL (ref 8.9–10.3)
Chloride: 102 mmol/L (ref 98–111)
Creatinine, Ser: 1.05 mg/dL — ABNORMAL HIGH (ref 0.44–1.00)
GFR calc Af Amer: >60 mL/min (ref >60)
GFR calc non Af Amer: 56 mL/min — ABNORMAL LOW (ref >60)
Glucose, Bld: 96 mg/dL (ref 70–99)
Potassium: 3 mmol/L — ABNORMAL LOW (ref 3.5–5.1)
Sodium: 140 mmol/L (ref 135–145)

## 2019-07-05 LAB — TROPONIN I (HIGH SENSITIVITY)
Troponin I (High Sensitivity): 8 ng/L (ref <18)
Troponin I (High Sensitivity): 12 ng/L (ref <18)

## 2019-07-05 MED ORDER — SODIUM CHLORIDE 0.9% FLUSH: 3.0000 mL | Freq: Once | INTRAVENOUS | Status: DC

## 2019-07-05 NOTE — ED Triage Notes (Signed)
Pt arrives via EMS from MD office. Pt has been having cp with exertion and increased BP over the past 6 months. Pt is on BP meds, but have not been helping. They did an EKG at the office today which showed her known LBBB- her MD told EMS it appears wider and more of an elevation. Pt is not having any CP today.Pt took a BC powder 4 hours ago, there is 1000mg  ASA in it per EMS so she did not give any more ASA. BP 165/91, HR 80's. resp 18, 97% on room air. CBG 123

## 2019-07-05 NOTE — ED Provider Notes (Signed)
[name] EMERGENCY DEPARTMENT Provider Note   CSN: [id number] Arrival date & time: 07/05/19  1709     History Chief Complaint  Patient presents with  . Chest Pain    Kristi Clay is a 64 y.o. female.  HPI  HPI: A 64 year old patient with a history of hypertension and hypercholesterolemia presents for evaluation of chest pain. Initial onset of pain was more than 6 hours ago. The patient's chest pain is described as heaviness/pressure/tightness and is worse with exertion. The patient's chest pain is middle- or left-sided, is not well-localized, is not sharp and does not radiate to the arms/jaw/neck. The patient does not complain of nausea and denies diaphoresis. The patient has a family history of coronary artery disease in a first-degree relative with onset less than age 18. The patient has no history of stroke, has no history of peripheral artery disease, has not smoked in the past 90 days, denies any history of treated diabetes and does not have an elevated BMI (>=30).   Additional history.  Patient reports that she has noted that her blood pressure has been high and refractory to her blood pressure medications at home.  She monitors this closely.  She reports blood pressures 170s over 100s.  She states over the last several days she has had some pressure in the middle of her chest as well as headaches.  Last episodes of symptoms were yesterday.  She woke up this morning and noted her blood pressure to be high.  She contacted her primary physician who evaluated her in the office.  They noted a left bundle branch block and were concerned that this may be changed.  They sent her here for further evaluation.  She is currently symptom-free and has been symptom-free since yesterday.  Past Medical History:  Diagnosis Date  . Hypertension   . LBBB (left bundle branch block)     Patient Active Problem List   Diagnosis Date Noted  . Sinusitis 06/26/2010  . Lipoma of  neck 06/26/2010  . HYPOTHYROIDISM, BORDERLINE 03/22/2010  . DERMATITIS, SCALP 03/22/2010  . MEMORY LOSS 11/28/2008  . HEADACHE 07/20/2007  . FATIGUE 03/31/2007  . ANXIETY 06/11/2006  . DEPRESSIVE DISORDER, NOS 06/11/2006  . CONSTIPATION 06/11/2006  . MENOPAUSAL SYNDROME 06/11/2006  . BACK PAIN, LOW 06/11/2006    Past Surgical History:  Procedure Laterality Date  . NASAL SEPTUM SURGERY       OB History   No obstetric history on file.     History reviewed. No pertinent family history.  Social History   Tobacco Use  . Smoking status: Never Smoker  . Smokeless tobacco: Never Used  Substance Use Topics  . Alcohol use: Not on file  . Drug use: No    Home Medications Prior to Admission medications   Medication Sig Start Date End Date Taking? Authorizing Provider  ARMOUR THYROID 15 MG tablet TAKE 1 TABLET BY MOUTH ONCE DAILY 07/31/12   Funches, Adriana Mccallum, MD  cephALEXin (KEFLEX) 500 MG capsule Take 1 capsule (500 mg total) by mouth 4 (four) times daily. 11/22/15   Janne Napoleon, NP  clonazePAM (KLONOPIN) 0.5 MG tablet Take 0.5 mg by mouth 3 (three) times daily as needed.      [provider]  fish oil-omega-3 fatty acids 1000 MG capsule Take 2 g by mouth daily.      [provider]  fluticasone (FLONASE) 50 MCG/ACT nasal spray 2 sprays by Nasal route daily. 07/17/10 07/17/11  Mariana Arn, MD  HYDROCORTISONE, TOPICAL, (ALA-SCALP) 2 % LOTN Apply small amount to itchy scalp twice a day     [provider]  predniSONE (DELTASONE) 50 MG tablet Take one 50 mg tablet once daily for the next five days. 01/08/19   Lannie Fields, PA-C    Allergies    Patient has no known allergies.  Review of Systems   Review of Systems  Constitutional: Negative for fever.  Respiratory: Positive for shortness of breath. Negative for cough.   Cardiovascular: Positive for chest pain. Negative for leg swelling.  Gastrointestinal: Negative for abdominal pain, nausea and vomiting.    Genitourinary: Negative for dysuria.  Neurological: Positive for headaches. Negative for weakness and numbness.  All other systems reviewed and are negative.   Physical Exam Updated Vital Signs BP (!) 161/83   Pulse 70   Temp 98.3 F (36.8 C) (Oral)   Resp 13   Ht 1.6 m (5\' 3" )   Wt 68 kg   SpO2 99%   BMI 26.57 kg/m   Physical Exam Vitals and nursing note reviewed.  Constitutional:      Appearance: She is well-developed. She is not ill-appearing.  HENT:     Head: Normocephalic and atraumatic.  Eyes:     Pupils: Pupils are equal, round, and reactive to light.  Cardiovascular:     Rate and Rhythm: Normal rate and regular rhythm.     Heart sounds: Normal heart sounds.  Pulmonary:     Effort: Pulmonary effort is normal. No respiratory distress.     Breath sounds: No wheezing.  Abdominal:     General: Bowel sounds are normal.     Palpations: Abdomen is soft.     Tenderness: There is no abdominal tenderness.  Musculoskeletal:     Cervical back: Neck supple.     Right lower leg: No tenderness. No edema.     Left lower leg: No tenderness. No edema.  Skin:    General: Skin is warm and dry.  Neurological:     Mental Status: She is alert and oriented to person, place, and time.  Psychiatric:        Mood and Affect: Mood normal.     ED Results / Procedures / Treatments   Labs (all labs ordered are listed, but only abnormal results are displayed) Labs Reviewed  BASIC METABOLIC PANEL - Abnormal; Notable for the following components:      Result Value   Potassium 3.0 (*)    Creatinine, Ser 1.05 (*)    GFR calc non Af Amer 56 (*)    Anion gap 16 (*)    All other components within normal limits  CBC  TROPONIN I (HIGH SENSITIVITY)  TROPONIN I (HIGH SENSITIVITY)    EKG EKG Interpretation  Date/Time:  Tuesday July 05 2019 17:19:50 EDT Ventricular Rate:  73 PR Interval:  166 QRS Duration: 136 QT Interval:  420 QTC Calculation: 462 R Axis:   -35 Text  Interpretation: Normal sinus rhythm Left axis deviation Left bundle branch block Abnormal ECG No prior for comparison Confirmed by Thayer Jew [phone]) on 07/05/2019 11:04:15 PM   Radiology DG Chest 2 View  Result Date: 07/05/2019 CLINICAL DATA:  Chest pain and hypertension EXAM: CHEST - 2 VIEW COMPARISON:  None. FINDINGS: Lungs are clear. Heart size and pulmonary vascularity are normal. No adenopathy. No pneumothorax. No bone lesions. IMPRESSION: Lungs clear.  Cardiac silhouette within normal limits. Electronically Signed   By: Lowella Grip III M.D.   On: 07/05/2019  18:17    Procedures Procedures (including critical care time)  Medications Ordered in ED Medications  sodium chloride flush (NS) 0.9 % injection 3 mL (has no administration in time range)    ED Course  I have reviewed the triage vital signs and the nursing notes.  Pertinent labs & imaging results that were available during my care of the patient were reviewed by me and considered in my medical decision making (see chart for details).    MDM Rules/Calculators/A&P HEAR Score: 5                     Patient presents with chest pain and high blood pressure from home.  She is on medications.  She has not had chest pain in over 24 hours.  She does report some exertional symptoms and has risk factors.  Her heart score is 5 based on heart pathway.  Troponins are negative x2 with a delta of 4.  She is nontoxic-appearing on exam.  She has a left bundle branch block but it appears she has previously had 1.  I discussed with her options including admission for observation and cardiology evaluation for definitive functional testing versus close outpatient follow-up for this testing.  I do feel she needs and warrants further testing.  Given that she has been chest pain-free and has had a negative work-up here today the I do feel it is reasonable that she follow-up as an outpatient.  Her blood pressures here have been marginal in the  160s to 170s range.  I do not believe she is currently in hypertensive urgency or emergency.  I have reviewed her chart and she did have a stress test that was normal in 2016 at [facility].  I discussed the risk and benefits with the patient.  Patient has elected to be discharged home and will follow up closely with cardiology.  I advised her to return immediately if she has any recurrent symptoms.  After history, exam, and medical workup I feel the patient has been appropriately medically screened and is safe for discharge home. Pertinent diagnoses were discussed with the patient. Patient was given return precautions.   Final Clinical Impression(s) / ED Diagnoses Final diagnoses:  Precordial pain  Essential hypertension    Rx / DC Orders ED Discharge Orders    None       Kidada Ging, Barbette Hair, MD 07/06/19 [id number]

## 2019-07-06 ENCOUNTER — Encounter: Payer: Self-pay | Admitting: Cardiology

## 2019-07-06 ENCOUNTER — Ambulatory Visit: Payer: BC Managed Care – PPO | Admitting: Cardiology

## 2019-07-06 VITALS — BP 155/88 | HR 83 | Ht 63.0 in | Wt 163.0 lb

## 2019-07-06 DIAGNOSIS — I447 Left bundle-branch block, unspecified: Secondary | ICD-10-CM

## 2019-07-06 DIAGNOSIS — I10 Essential (primary) hypertension: Secondary | ICD-10-CM

## 2019-07-06 DIAGNOSIS — R079 Chest pain, unspecified: Secondary | ICD-10-CM | POA: Diagnosis not present

## 2019-07-06 DIAGNOSIS — E785 Hyperlipidemia, unspecified: Secondary | ICD-10-CM

## 2019-07-06 MED ORDER — NITROGLYCERIN 0.4 MG SL SUBL: 0.4000 mg | SUBLINGUAL_TABLET | SUBLINGUAL | 3 refills | Status: AC | PRN

## 2019-07-06 MED ORDER — METOPROLOL TARTRATE 100 MG PO TABS: ORAL_TABLET | ORAL | 0 refills | Status: AC

## 2019-07-06 MED ORDER — ATORVASTATIN CALCIUM 40 MG PO TABS: 40.0000 mg | ORAL_TABLET | Freq: Every day | ORAL | 3 refills | Status: DC

## 2019-07-06 MED ORDER — CARVEDILOL 6.25 MG PO TABS: 6.2500 mg | ORAL_TABLET | Freq: Two times a day (BID) | ORAL | 3 refills | Status: DC

## 2019-07-06 NOTE — Progress Notes (Signed)
Cardiology Office Note:    Date:  07/06/2019   ID:  Kristi Clay, DOB [birth year], MRN [id number]  PCP:  Deland Pretty, MD  Cardiologist:  No primary care provider on file.  Electrophysiologist:  None   Referring MD: Deland Pretty, MD   Chief Complaint  Patient presents with  . Chest Pain    History of Present Illness:    Kristi Clay is a 64 y.o. female with a hx of hypertension, hyperlipidemia who presents as a ED follow-up for chest pain.  She was seen in the ED on 07/05/2019 for chest pain.  EKG notable for left bundle branch block.  High-sensitivity troponin 8 then 12.  She reports that she has been having chest pain several times per week.  Describes as chest tightness, center/left side of chest.  Occurs with exertion.  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  Resolves with rest.  Denies any recent resting chest pain.  Underwent a stress echo for chest pain in 2016 at [facility], which showed no evidence of ischemia and normal LV systolic function.  Never smoked.  Father died age 24 of MI, had first MI in 7s.  Mother had MI in 48s.     Past Medical History:  Diagnosis Date  . Hypertension   . LBBB (left bundle branch block)     Past Surgical History:  Procedure Laterality Date  . NASAL SEPTUM SURGERY      Current Medications: Current Meds  Medication Sig  . amLODipine (NORVASC) 2.5 MG tablet Take 1 tablet by mouth as directed.   Marland Kitchen buPROPion (WELLBUTRIN XL) 300 MG 24 hr tablet Take 300 mg by mouth every morning.  . cyclobenzaprine (FLEXERIL) 10 MG tablet Take by mouth as directed.  . hydrochlorothiazide (HYDRODIURIL) 25 MG tablet Take 25 mg by mouth every morning.  Marland Kitchen Lifitegrast (XIIDRA) 5 % SOLN Place 1 drop into both eyes as directed.  Marland Kitchen omeprazole (PRILOSEC) 40 MG capsule Take 40 mg by mouth every morning.  . [DISCONTINUED] pravastatin (PRAVACHOL) 40 MG tablet Take 40 mg by mouth daily.      Allergies:   Patient has no known allergies.   Social History   Socioeconomic History  . Marital status: Widowed    Spouse name: Not on file  . Number of children: Not on file  . Years of education: Not on file  . Highest education level: Not on file  Occupational History  . Not on file  Tobacco Use  . Smoking status: Never Smoker  . Smokeless tobacco: Never Used  Substance and Sexual Activity  . Alcohol use: Not on file  . Drug use: No  . Sexual activity: Not on file  Other Topics Concern  . Not on file  Social History Narrative  . Not on file   Social Determinants of Health   Financial Resource Strain:   . Difficulty of Paying Living Expenses:   Food Insecurity:   . Worried About Charity fundraiser in the Last Year:   . Arboriculturist in the Last Year:   Transportation Needs:   . Film/video editor (Medical):   Marland Kitchen Lack of Transportation (Non-Medical):   Physical Activity:   . Days of Exercise per Week:   . Minutes of Exercise per Session:   Stress:   . Feeling of Stress :   Social Connections:   . Frequency of Communication  with Friends and Family:   . Frequency of Social Gatherings with Friends and Family:   . Attends Religious Services:   . Active Member of Clubs or Organizations:   . Attends Archivist Meetings:   Marland Kitchen Marital Status:      Family History: Father died age 9 of MI, had first MI in 64s.  Mother had MI in 57s.    ROS:   Please see the history of present illness.    All other systems reviewed and are negative.  EKGs/Labs/Other Studies Reviewed:    The following studies were reviewed today:   EKG:  EKG is ordered today.  The ekg ordered today demonstrates sinus rhythm, left bundle branch block, rate 86  Recent Labs: 07/05/2019: BUN 21; Creatinine, Ser 1.05; Hemoglobin 13.8; Platelets 276; Potassium 3.0; Sodium 140  Recent Lipid Panel No results found for: CHOL, TRIG, HDL, CHOLHDL, VLDL, LDLCALC, LDLDIRECT  Physical  Exam:    VS:  BP (!) 155/88 (BP Location: Left Arm)   Pulse 83   Ht 5\' 3"  (1.6 m)   Wt 163 lb (73.9 kg)   BMI 28.87 kg/m     Wt Readings from Last 3 Encounters:  07/06/19 163 lb (73.9 kg)  07/05/19 150 lb (68 kg)  01/08/19 158 lb (71.7 kg)     [id number] nourished, well developed in no acute distress HEENT: Normal NECK: No JVD; No carotid bruits LYMPHATICS: No lymphadenopathy CARDIAC: RRR, no murmurs, rubs, gallops RESPIRATORY:  Clear to auscultation without rales, wheezing or rhonchi  ABDOMEN: Soft, non-tender, non-distended MUSCULOSKELETAL:  No edema; No deformity  SKIN: Warm and dry NEUROLOGIC:  Alert and oriented x 3 PSYCHIATRIC:  Normal affect   ASSESSMENT:    1. LBBB (left bundle branch block)   2. Chest pain, unspecified type   3. Essential hypertension   4. Hyperlipidemia, unspecified hyperlipidemia type    PLAN:     Chest pain: Description concerning for typical angina.  Has multiple CAD risk factors (hypertension, hyperlipidemia, family history) -Coronary CTA -Start carvedilol 6.25 mg twice daily -Sublingual nitroglycerin as needed  Left bundle branch block: will check TTE to evaluate LV systolic function  Hypertension: On hydrochlorothiazide 25 mg daily and takes amlodipine 2.5 mg daily as needed.  BP elevated, adding coreg 6.25 mg BID for antianginal effect as above  Hyperlipidemia: On pravastatin 40 mg daily.  LDL 133 on 10/04/2018.  10-year ASCVD risk score is 9%.  Recommend high intensity statin, will switch to atorvastatin 40 mg daily  RTC in 3 months   Medication Adjustments/Labs and Tests Ordered: Current medicines are reviewed at length with the patient today.  Concerns regarding medicines are outlined above.  Orders Placed This Encounter  Procedures  . CT CORONARY MORPH W/CTA COR W/SCORE W/CA W/CM &/OR WO/CM  . CT CORONARY FRACTIONAL FLOW RESERVE DATA PREP  . CT CORONARY FRACTIONAL FLOW RESERVE FLUID ANALYSIS  . EKG 12-Lead  . ECHOCARDIOGRAM  COMPLETE   Meds ordered this encounter  Medications  . nitroGLYCERIN (NITROSTAT) 0.4 MG SL tablet    Sig: Place 1 tablet (0.4 mg total) under the tongue every 5 (five) minutes as needed for chest pain.    Dispense:  25 tablet    Refill:  3  . carvedilol (COREG) 6.25 MG tablet    Sig: Take 1 tablet (6.25 mg total) by mouth 2 (two) times daily.    Dispense:  180 tablet    Refill:  3  . atorvastatin (LIPITOR) 40 MG tablet  Sig: Take 1 tablet (40 mg total) by mouth daily.    Dispense:  90 tablet    Refill:  3  . metoprolol tartrate (LOPRESSOR) 100 MG tablet    Sig: Take 100 mg (1 tablet) 2 HOURS PRIOR TO CT-HOLD COREG    Dispense:  1 tablet    Refill:  0    Patient Instructions  Medication Instructions:  START carvedilol (Coreg) 6.25 mg two times daily  STOP pravastatin START atorvastatin (Lipitor) 40 mg daily  AM of CT scan-HOLD COREG and HCTZ, take metoprolol tartrate (Lopressor) 100 mg TWO HOURS PRIOR  Take sublingual nitroglycerin AS NEEDED for chest pain  *If you need a refill on your cardiac medications before your next appointment, please call your pharmacy*   Lab Work: NONE  Testing/Procedures: Your physician has requested that you have an echocardiogram. Echocardiography is a painless test that uses sound waves to create images of your heart. It provides your doctor with information about the size and shape of your heart and how well your heart's chambers and valves are working. This procedure takes approximately one hour. There are no restrictions for this procedure.  This will be done at our [facility] location:  [place] has requested that you have cardiac CT. Cardiac computed tomography (CT) is a painless test that uses an x-ray machine to take clear, detailed pictures of your heart. For further information please visit [email or web address]. Please follow instruction sheet as given.   Follow-Up: At [facility], you  and your health needs are our priority.  As part of our continuing mission to provide you with exceptional heart care, we have created designated Provider Care Teams.  These Care Teams include your primary Cardiologist (physician) and Advanced Practice Providers (APPs -  Physician Assistants and Nurse Practitioners) who all work together to provide you with the care you need, when you need it.  We recommend signing up for the patient portal called "MyChart".  Sign up information is provided on this After Visit Summary.  MyChart is used to connect with patients for Virtual Visits (Telemedicine).  Patients are able to view lab/test results, encounter notes, upcoming appointments, etc.  Non-urgent messages can be sent to your provider as well.   To learn more about what you can do with MyChart, go to [email or web address].    Your next appointment:   3 month(s)  The format for your next appointment:   In Person  Provider:   Oswaldo Milian, MD   Other Instructions ---check your blood pressure daily and write it down, call in 2 weeks with readings for Dr. Gardiner Rhyme to review   Nitroglycerin sublingual tablets What is this medicine? NITROGLYCERIN (nye troe GLI ser in) is a type of vasodilator. It relaxes blood vessels, increasing the blood and oxygen supply to your heart. This medicine is used to relieve chest pain caused by angina. It is also used to prevent chest pain before activities like climbing stairs, going outdoors in cold weather, or sexual activity. This medicine may be used for other purposes; ask your health care provider or pharmacist if you have questions. COMMON BRAND NAME(S): Nitroquick, Nitrostat, Nitrotab What should I tell my health care provider before I take this medicine? They need to know if you have any of these conditions:  anemia  head injury, recent stroke, or bleeding in the brain  liver disease  previous heart attack  an unusual or allergic reaction  to nitroglycerin, other medicines, foods,  dyes, or preservatives  pregnant or trying to get pregnant  breast-feeding How should I use this medicine? Take this medicine by mouth as needed. At the first sign of an angina attack (chest pain or tightness) place one tablet under your tongue. You can also take this medicine 5 to 10 minutes before an event likely to produce chest pain. Follow the directions on the prescription label. Let the tablet dissolve under the tongue. Do not swallow whole. Replace the dose if you accidentally swallow it. It will help if your mouth is not dry. Saliva around the tablet will help it to dissolve more quickly. Do not eat or drink, smoke or chew tobacco while a tablet is dissolving. If you are not better within 5 minutes after taking ONE dose of nitroglycerin, call 9-1-1 immediately to seek emergency medical care. Do not take more than 3 nitroglycerin tablets over 15 minutes. If you take this medicine often to relieve symptoms of angina, your doctor or health care professional may provide you with different instructions to manage your symptoms. If symptoms do not go away after following these instructions, it is important to call 9-1-1 immediately. Do not take more than 3 nitroglycerin tablets over 15 minutes. Talk to your pediatrician regarding the use of this medicine in children. Special care may be needed. Overdosage: If you think you have taken too much of this medicine contact a poison control center or emergency room at once. NOTE: This medicine is only for you. Do not share this medicine with others. What if I miss a dose? This does not apply. This medicine is only used as needed. What may interact with this medicine? Do not take this medicine with any of the following medications:  certain migraine medicines like ergotamine and dihydroergotamine (DHE)  medicines used to treat erectile dysfunction like sildenafil, tadalafil, and vardenafil  riociguat This  medicine may also interact with the following medications:  alteplase  aspirin  heparin  medicines for high blood pressure  medicines for mental depression  other medicines used to treat angina  phenothiazines like chlorpromazine, mesoridazine, prochlorperazine, thioridazine This list may not describe all possible interactions. Give your health care provider a list of all the medicines, herbs, non-prescription drugs, or dietary supplements you use. Also tell them if you smoke, drink alcohol, or use illegal drugs. Some items may interact with your medicine. What should I watch for while using this medicine? Tell your doctor or health care professional if you feel your medicine is no longer working. Keep this medicine with you at all times. Sit or lie down when you take your medicine to prevent falling if you feel dizzy or faint after using it. Try to remain calm. This will help you to feel better faster. If you feel dizzy, take several deep breaths and lie down with your feet propped up, or bend forward with your head resting between your knees. You may get drowsy or dizzy. Do not drive, use machinery, or do anything that needs mental alertness until you know how this drug affects you. Do not stand or sit up quickly, especially if you are an older patient. This reduces the risk of dizzy or fainting spells. Alcohol can make you more drowsy and dizzy. Avoid alcoholic drinks. Do not treat yourself for coughs, colds, or pain while you are taking this medicine without asking your doctor or health care professional for advice. Some ingredients may increase your blood pressure. What side effects may I notice from receiving this medicine?  Side effects that you should report to your doctor or health care professional as soon as possible:  blurred vision  dry mouth  skin rash  sweating  the feeling of extreme pressure in the head  unusually weak or tired Side effects that usually do not  require medical attention (report to your doctor or health care professional if they continue or are bothersome):  flushing of the face or neck  headache  irregular heartbeat, palpitations  nausea, vomiting This list may not describe all possible side effects. Call your doctor for medical advice about side effects. You may report side effects to FDA at 1-800-FDA-1088. Where should I keep my medicine? Keep out of the reach of children. Store at room temperature between 20 and 25 degrees C (68 and 77 degrees F). Store in [employer]. Protect from light and moisture. Keep tightly closed. Throw away any unused medicine after the expiration date. NOTE: This sheet is a summary. It may not cover all possible information. If you have questions about this medicine, talk to your doctor, pharmacist, or health care provider.  2020 Elsevier/Gold Standard (2013-01-27 17:57:36)     Signed, Donato Heinz, MD  07/06/2019 6:39 PM    [place]

## 2019-07-06 NOTE — Discharge Instructions (Addendum)
You were seen today for high blood pressure and intermittent chest pain.  Your work-up today is reassuring without any elevation in your cardiac enzymes.  This does not mean that you do not have heart disease but it does mean that it does not appear that you are actively having a heart attack.  Follow-up closely with cardiology.  If you experience any new or worsening symptoms, you should be re-evaluated immediately.

## 2019-07-06 NOTE — Patient Instructions (Addendum)
Medication Instructions:  START carvedilol (Coreg) 6.25 mg two times daily  STOP pravastatin START atorvastatin (Lipitor) 40 mg daily  AM of CT scan-HOLD COREG and HCTZ, take metoprolol tartrate (Lopressor) 100 mg TWO HOURS PRIOR  Take sublingual nitroglycerin AS NEEDED for chest pain  *If you need a refill on your cardiac medications before your next appointment, please call your pharmacy*   Lab Work: NONE  Testing/Procedures: Your physician has requested that you have an echocardiogram. Echocardiography is a painless test that uses sound waves to create images of your heart. It provides your doctor with information about the size and shape of your heart and how well your heart's chambers and valves are working. This procedure takes approximately one hour. There are no restrictions for this procedure.  This will be done at our [facility] location:  [place] has requested that you have cardiac CT. Cardiac computed tomography (CT) is a painless test that uses an x-ray machine to take clear, detailed pictures of your heart. For further information please visit [email or web address]. Please follow instruction sheet as given.   Follow-Up: At [facility], you and your health needs are our priority.  As part of our continuing mission to provide you with exceptional heart care, we have created designated Provider Care Teams.  These Care Teams include your primary Cardiologist (physician) and Advanced Practice Providers (APPs -  Physician Assistants and Nurse Practitioners) who all work together to provide you with the care you need, when you need it.  We recommend signing up for the patient portal called "MyChart".  Sign up information is provided on this After Visit Summary.  MyChart is used to connect with patients for Virtual Visits (Telemedicine).  Patients are able to view lab/test results, encounter notes, upcoming appointments, etc.  Non-urgent  messages can be sent to your provider as well.   To learn more about what you can do with MyChart, go to [email or web address].    Your next appointment:   3 month(s)  The format for your next appointment:   In Person  Provider:   Oswaldo Milian, MD   Other Instructions ---check your blood pressure daily and write it down, call in 2 weeks with readings for Dr. Gardiner Rhyme to review   Nitroglycerin sublingual tablets What is this medicine? NITROGLYCERIN (nye troe GLI ser in) is a type of vasodilator. It relaxes blood vessels, increasing the blood and oxygen supply to your heart. This medicine is used to relieve chest pain caused by angina. It is also used to prevent chest pain before activities like climbing stairs, going outdoors in cold weather, or sexual activity. This medicine may be used for other purposes; ask your health care provider or pharmacist if you have questions. COMMON BRAND NAME(S): Nitroquick, Nitrostat, Nitrotab What should I tell my health care provider before I take this medicine? They need to know if you have any of these conditions:  anemia  head injury, recent stroke, or bleeding in the brain  liver disease  previous heart attack  an unusual or allergic reaction to nitroglycerin, other medicines, foods, dyes, or preservatives  pregnant or trying to get pregnant  breast-feeding How should I use this medicine? Take this medicine by mouth as needed. At the first sign of an angina attack (chest pain or tightness) place one tablet under your tongue. You can also take this medicine 5 to 10 minutes before an event likely to produce chest pain. Follow the directions on the  prescription label. Let the tablet dissolve under the tongue. Do not swallow whole. Replace the dose if you accidentally swallow it. It will help if your mouth is not dry. Saliva around the tablet will help it to dissolve more quickly. Do not eat or drink, smoke or chew tobacco while a  tablet is dissolving. If you are not better within 5 minutes after taking ONE dose of nitroglycerin, call 9-1-1 immediately to seek emergency medical care. Do not take more than 3 nitroglycerin tablets over 15 minutes. If you take this medicine often to relieve symptoms of angina, your doctor or health care professional may provide you with different instructions to manage your symptoms. If symptoms do not go away after following these instructions, it is important to call 9-1-1 immediately. Do not take more than 3 nitroglycerin tablets over 15 minutes. Talk to your pediatrician regarding the use of this medicine in children. Special care may be needed. Overdosage: If you think you have taken too much of this medicine contact a poison control center or emergency room at once. NOTE: This medicine is only for you. Do not share this medicine with others. What if I miss a dose? This does not apply. This medicine is only used as needed. What may interact with this medicine? Do not take this medicine with any of the following medications:  certain migraine medicines like ergotamine and dihydroergotamine (DHE)  medicines used to treat erectile dysfunction like sildenafil, tadalafil, and vardenafil  riociguat This medicine may also interact with the following medications:  alteplase  aspirin  heparin  medicines for high blood pressure  medicines for mental depression  other medicines used to treat angina  phenothiazines like chlorpromazine, mesoridazine, prochlorperazine, thioridazine This list may not describe all possible interactions. Give your health care provider a list of all the medicines, herbs, non-prescription drugs, or dietary supplements you use. Also tell them if you smoke, drink alcohol, or use illegal drugs. Some items may interact with your medicine. What should I watch for while using this medicine? Tell your doctor or health care professional if you feel your medicine is no  longer working. Keep this medicine with you at all times. Sit or lie down when you take your medicine to prevent falling if you feel dizzy or faint after using it. Try to remain calm. This will help you to feel better faster. If you feel dizzy, take several deep breaths and lie down with your feet propped up, or bend forward with your head resting between your knees. You may get drowsy or dizzy. Do not drive, use machinery, or do anything that needs mental alertness until you know how this drug affects you. Do not stand or sit up quickly, especially if you are an older patient. This reduces the risk of dizzy or fainting spells. Alcohol can make you more drowsy and dizzy. Avoid alcoholic drinks. Do not treat yourself for coughs, colds, or pain while you are taking this medicine without asking your doctor or health care professional for advice. Some ingredients may increase your blood pressure. What side effects may I notice from receiving this medicine? Side effects that you should report to your doctor or health care professional as soon as possible:  blurred vision  dry mouth  skin rash  sweating  the feeling of extreme pressure in the head  unusually weak or tired Side effects that usually do not require medical attention (report to your doctor or health care professional if they continue or are bothersome):  flushing of the face or neck  headache  irregular heartbeat, palpitations  nausea, vomiting This list may not describe all possible side effects. Call your doctor for medical advice about side effects. You may report side effects to FDA at 1-800-FDA-1088. Where should I keep my medicine? Keep out of the reach of children. Store at room temperature between 20 and 25 degrees C (68 and 77 degrees F). Store in [employer]. Protect from light and moisture. Keep tightly closed. Throw away any unused medicine after the expiration date. NOTE: This sheet is a summary. It may not  cover all possible information. If you have questions about this medicine, talk to your doctor, pharmacist, or health care provider.  2020 Elsevier/Gold Standard (2013-01-27 17:57:36)

## 2019-07-11 ENCOUNTER — Other Ambulatory Visit: Payer: Self-pay

## 2019-07-11 ENCOUNTER — Ambulatory Visit (HOSPITAL_COMMUNITY): Payer: BC Managed Care – PPO | Attending: Cardiology

## 2019-07-11 DIAGNOSIS — R079 Chest pain, unspecified: Secondary | ICD-10-CM

## 2019-08-09 ENCOUNTER — Telehealth (HOSPITAL_COMMUNITY): Payer: Self-pay | Admitting: Emergency Medicine

## 2019-08-09 NOTE — Telephone Encounter (Signed)
No answer when called, unable to leave VM  Marchia Bond RN Navigator Cardiac Imaging [facility] [phone] Office  [phone] Cell

## 2019-08-10 ENCOUNTER — Ambulatory Visit (HOSPITAL_COMMUNITY)
Admission: RE | Admit: 2019-08-10 | Discharge: 2019-08-10 | Disposition: A | Discharge status: Home / Self Care | Payer: BC Managed Care – PPO | Source: Ambulatory Visit | Attending: Cardiology | Admitting: Cardiology

## 2019-08-10 ENCOUNTER — Other Ambulatory Visit: Payer: Self-pay

## 2019-08-10 ENCOUNTER — Encounter (HOSPITAL_COMMUNITY): Payer: Self-pay

## 2019-08-10 DIAGNOSIS — R079 Chest pain, unspecified: Secondary | ICD-10-CM

## 2019-08-10 MED ORDER — IOHEXOL 350 MG/ML SOLN
100.0000 mL | Freq: Once | INTRAVENOUS | Status: AC | PRN
  Administered 2019-08-10: 100 mL via INTRAVENOUS

## 2019-08-10 MED ORDER — NITROGLYCERIN 0.4 MG SL SUBL
SUBLINGUAL_TABLET | SUBLINGUAL | Status: AC
  Administered 2019-08-10: 0.8 mg via SUBLINGUAL
  Filled 2019-08-10: qty 2

## 2019-08-10 MED ORDER — NITROGLYCERIN 0.4 MG SL SUBL: 0.8000 mg | SUBLINGUAL_TABLET | Freq: Once | SUBLINGUAL | Status: AC

## 2019-08-10 NOTE — Progress Notes (Signed)
Patient tolerated CT well. Drank water after. Ambulatory steady gait to exit.  

## 2019-10-07 ENCOUNTER — Ambulatory Visit: Payer: BC Managed Care – PPO | Admitting: Cardiology

## 2019-11-08 DIAGNOSIS — E7800 Pure hypercholesterolemia, unspecified: Secondary | ICD-10-CM | POA: Diagnosis not present

## 2019-11-08 DIAGNOSIS — Z0000 Encounter for general adult medical examination without abnormal findings: Secondary | ICD-10-CM | POA: Diagnosis not present

## 2019-11-08 DIAGNOSIS — I10 Essential (primary) hypertension: Secondary | ICD-10-CM | POA: Diagnosis not present

## 2019-11-11 DIAGNOSIS — I10 Essential (primary) hypertension: Secondary | ICD-10-CM | POA: Diagnosis not present

## 2019-11-11 DIAGNOSIS — E7800 Pure hypercholesterolemia, unspecified: Secondary | ICD-10-CM | POA: Diagnosis not present

## 2019-11-11 DIAGNOSIS — Z0000 Encounter for general adult medical examination without abnormal findings: Secondary | ICD-10-CM | POA: Diagnosis not present

## 2020-01-11 DIAGNOSIS — Z23 Encounter for immunization: Secondary | ICD-10-CM | POA: Diagnosis not present

## 2020-04-25 DIAGNOSIS — F419 Anxiety disorder, unspecified: Secondary | ICD-10-CM | POA: Diagnosis not present

## 2020-04-25 DIAGNOSIS — K219 Gastro-esophageal reflux disease without esophagitis: Secondary | ICD-10-CM | POA: Diagnosis not present

## 2020-04-25 DIAGNOSIS — F439 Reaction to severe stress, unspecified: Secondary | ICD-10-CM | POA: Diagnosis not present

## 2020-04-25 DIAGNOSIS — R0683 Snoring: Secondary | ICD-10-CM | POA: Diagnosis not present

## 2020-05-08 DIAGNOSIS — Z03818 Encounter for observation for suspected exposure to other biological agents ruled out: Secondary | ICD-10-CM | POA: Diagnosis not present

## 2020-08-01 ENCOUNTER — Other Ambulatory Visit: Payer: Self-pay | Admitting: Cardiology

## 2020-08-01 NOTE — Telephone Encounter (Signed)
This is Dr. Schumann's pt 

## 2020-08-02 DIAGNOSIS — E7800 Pure hypercholesterolemia, unspecified: Secondary | ICD-10-CM | POA: Diagnosis not present

## 2020-08-02 DIAGNOSIS — R202 Paresthesia of skin: Secondary | ICD-10-CM | POA: Diagnosis not present

## 2020-08-07 DIAGNOSIS — I10 Essential (primary) hypertension: Secondary | ICD-10-CM | POA: Diagnosis not present

## 2020-08-07 DIAGNOSIS — E7800 Pure hypercholesterolemia, unspecified: Secondary | ICD-10-CM | POA: Diagnosis not present

## 2020-08-07 DIAGNOSIS — R202 Paresthesia of skin: Secondary | ICD-10-CM | POA: Diagnosis not present

## 2020-08-20 ENCOUNTER — Other Ambulatory Visit: Payer: Self-pay | Admitting: Cardiology

## 2020-08-20 NOTE — Telephone Encounter (Signed)
This is Dr. Schumann's pt 

## 2020-10-04 ENCOUNTER — Other Ambulatory Visit: Payer: Self-pay | Admitting: Cardiology

## 2020-11-02 ENCOUNTER — Other Ambulatory Visit: Payer: Self-pay | Admitting: Cardiology

## 2020-11-14 ENCOUNTER — Ambulatory Visit: Payer: BC Managed Care – PPO | Admitting: Cardiology

## 2020-11-30 ENCOUNTER — Other Ambulatory Visit: Payer: Self-pay | Admitting: Cardiology

## 2020-12-07 DIAGNOSIS — Z0000 Encounter for general adult medical examination without abnormal findings: Secondary | ICD-10-CM | POA: Diagnosis not present

## 2020-12-07 DIAGNOSIS — E559 Vitamin D deficiency, unspecified: Secondary | ICD-10-CM | POA: Diagnosis not present

## 2020-12-07 DIAGNOSIS — E7800 Pure hypercholesterolemia, unspecified: Secondary | ICD-10-CM | POA: Diagnosis not present

## 2020-12-14 DIAGNOSIS — E559 Vitamin D deficiency, unspecified: Secondary | ICD-10-CM | POA: Diagnosis not present

## 2020-12-14 DIAGNOSIS — I10 Essential (primary) hypertension: Secondary | ICD-10-CM | POA: Diagnosis not present

## 2020-12-14 DIAGNOSIS — Z01419 Encounter for gynecological examination (general) (routine) without abnormal findings: Secondary | ICD-10-CM | POA: Diagnosis not present

## 2020-12-14 DIAGNOSIS — Z0000 Encounter for general adult medical examination without abnormal findings: Secondary | ICD-10-CM | POA: Diagnosis not present

## 2020-12-14 DIAGNOSIS — E7800 Pure hypercholesterolemia, unspecified: Secondary | ICD-10-CM | POA: Diagnosis not present

## 2020-12-14 DIAGNOSIS — Z1151 Encounter for screening for human papillomavirus (HPV): Secondary | ICD-10-CM | POA: Diagnosis not present

## 2020-12-26 DIAGNOSIS — H35373 Puckering of macula, bilateral: Secondary | ICD-10-CM | POA: Diagnosis not present

## 2021-01-04 ENCOUNTER — Encounter: Payer: Self-pay | Admitting: Internal Medicine

## 2021-01-07 DIAGNOSIS — H26493 Other secondary cataract, bilateral: Secondary | ICD-10-CM | POA: Diagnosis not present

## 2021-01-10 DIAGNOSIS — Z20828 Contact with and (suspected) exposure to other viral communicable diseases: Secondary | ICD-10-CM | POA: Diagnosis not present

## 2021-01-20 ENCOUNTER — Emergency Department (HOSPITAL_COMMUNITY)
Admission: EM | Admit: 2021-01-20 | Discharge: 2021-01-20 | Disposition: A | Discharge status: Home / Self Care | Payer: BC Managed Care – PPO | Attending: Emergency Medicine | Admitting: Emergency Medicine

## 2021-01-20 ENCOUNTER — Encounter (HOSPITAL_COMMUNITY): Payer: Self-pay | Admitting: Emergency Medicine

## 2021-01-20 ENCOUNTER — Emergency Department (HOSPITAL_COMMUNITY): Payer: BC Managed Care – PPO

## 2021-01-20 ENCOUNTER — Other Ambulatory Visit: Payer: Self-pay

## 2021-01-20 DIAGNOSIS — Z79899 Other long term (current) drug therapy: Secondary | ICD-10-CM | POA: Insufficient documentation

## 2021-01-20 DIAGNOSIS — M7989 Other specified soft tissue disorders: Secondary | ICD-10-CM | POA: Insufficient documentation

## 2021-01-20 DIAGNOSIS — R0902 Hypoxemia: Secondary | ICD-10-CM | POA: Diagnosis not present

## 2021-01-20 DIAGNOSIS — I10 Essential (primary) hypertension: Secondary | ICD-10-CM | POA: Insufficient documentation

## 2021-01-20 DIAGNOSIS — E039 Hypothyroidism, unspecified: Secondary | ICD-10-CM | POA: Insufficient documentation

## 2021-01-20 DIAGNOSIS — I959 Hypotension, unspecified: Secondary | ICD-10-CM | POA: Diagnosis not present

## 2021-01-20 DIAGNOSIS — S82831A Other fracture of upper and lower end of right fibula, initial encounter for closed fracture: Secondary | ICD-10-CM | POA: Diagnosis not present

## 2021-01-20 DIAGNOSIS — S8261XA Displaced fracture of lateral malleolus of right fibula, initial encounter for closed fracture: Secondary | ICD-10-CM | POA: Diagnosis not present

## 2021-01-20 DIAGNOSIS — Z043 Encounter for examination and observation following other accident: Secondary | ICD-10-CM | POA: Diagnosis not present

## 2021-01-20 DIAGNOSIS — Y906 Blood alcohol level of 120-199 mg/100 ml: Secondary | ICD-10-CM | POA: Insufficient documentation

## 2021-01-20 DIAGNOSIS — R52 Pain, unspecified: Secondary | ICD-10-CM

## 2021-01-20 DIAGNOSIS — R519 Headache, unspecified: Secondary | ICD-10-CM | POA: Diagnosis not present

## 2021-01-20 DIAGNOSIS — S82851A Displaced trimalleolar fracture of right lower leg, initial encounter for closed fracture: Secondary | ICD-10-CM | POA: Diagnosis not present

## 2021-01-20 DIAGNOSIS — W109XXA Fall (on) (from) unspecified stairs and steps, initial encounter: Secondary | ICD-10-CM | POA: Diagnosis not present

## 2021-01-20 DIAGNOSIS — F10929 Alcohol use, unspecified with intoxication, unspecified: Secondary | ICD-10-CM | POA: Diagnosis not present

## 2021-01-20 DIAGNOSIS — S9304XA Dislocation of right ankle joint, initial encounter: Secondary | ICD-10-CM | POA: Diagnosis not present

## 2021-01-20 DIAGNOSIS — Y92009 Unspecified place in unspecified non-institutional (private) residence as the place of occurrence of the external cause: Secondary | ICD-10-CM | POA: Diagnosis not present

## 2021-01-20 DIAGNOSIS — W19XXXA Unspecified fall, initial encounter: Secondary | ICD-10-CM

## 2021-01-20 DIAGNOSIS — M47812 Spondylosis without myelopathy or radiculopathy, cervical region: Secondary | ICD-10-CM | POA: Diagnosis not present

## 2021-01-20 DIAGNOSIS — S99911A Unspecified injury of right ankle, initial encounter: Secondary | ICD-10-CM | POA: Diagnosis not present

## 2021-01-20 DIAGNOSIS — S82851D Displaced trimalleolar fracture of right lower leg, subsequent encounter for closed fracture with routine healing: Secondary | ICD-10-CM | POA: Diagnosis not present

## 2021-01-20 LAB — CBC WITH DIFFERENTIAL/PLATELET
Abs Immature Granulocytes: 0.09 10*3/uL — ABNORMAL HIGH (ref 0.00–0.07)
Basophils Absolute: 0.1 10*3/uL (ref 0.0–0.1)
Basophils Relative: 0 %
Eosinophils Absolute: 0.2 10*3/uL (ref 0.0–0.5)
Eosinophils Relative: 2 %
HCT: 41.6 % (ref 36.0–46.0)
Hemoglobin: 13.3 g/dL (ref 12.0–15.0)
Immature Granulocytes: 1 %
Lymphocytes Relative: 14 %
Lymphs Abs: 1.6 10*3/uL (ref 0.7–4.0)
MCH: 30.3 pg (ref 26.0–34.0)
MCHC: 32 g/dL (ref 30.0–36.0)
MCV: 94.8 fL (ref 80.0–100.0)
Monocytes Absolute: 0.8 10*3/uL (ref 0.1–1.0)
Monocytes Relative: 7 %
Neutro Abs: 8.6 10*3/uL — ABNORMAL HIGH (ref 1.7–7.7)
Neutrophils Relative %: 76 %
Platelets: 335 10*3/uL (ref 150–400)
RBC: 4.39 MIL/uL (ref 3.87–5.11)
RDW: 13.2 % (ref 11.5–15.5)
WBC: 11.2 10*3/uL — ABNORMAL HIGH (ref 4.0–10.5)
nRBC: 0 % (ref 0.0–0.2)

## 2021-01-20 LAB — COMPREHENSIVE METABOLIC PANEL
ALT: 26 U/L (ref 0–44)
AST: 31 U/L (ref 15–41)
Albumin: 3.8 g/dL (ref 3.5–5.0)
Alkaline Phosphatase: 98 U/L (ref 38–126)
Anion gap: 10 (ref 5–15)
BUN: 13 mg/dL (ref 8–23)
CO2: 25 mmol/L (ref 22–32)
Calcium: 9.4 mg/dL (ref 8.9–10.3)
Chloride: 107 mmol/L (ref 98–111)
Creatinine, Ser: 0.92 mg/dL (ref 0.44–1.00)
GFR, Estimated: >60 mL/min (ref >60)
Glucose, Bld: 113 mg/dL — ABNORMAL HIGH (ref 70–99)
Potassium: 5 mmol/L (ref 3.5–5.1)
Sodium: 142 mmol/L (ref 135–145)
Total Bilirubin: 0.1 mg/dL — ABNORMAL LOW (ref 0.3–1.2)
Total Protein: 7 g/dL (ref 6.5–8.1)

## 2021-01-20 LAB — ETHANOL: Alcohol, Ethyl (B): 196 mg/dL — ABNORMAL HIGH (ref <10)

## 2021-01-20 LAB — PROTIME-INR
INR: 0.9 (ref 0.8–1.2)
Prothrombin Time: 11.8 seconds (ref 11.4–15.2)

## 2021-01-20 MED ORDER — KETAMINE HCL 50 MG/5ML IJ SOSY
60.0000 mg | PREFILLED_SYRINGE | Freq: Once | INTRAMUSCULAR | Status: AC
  Administered 2021-01-20: 30 mg via INTRAVENOUS
  Filled 2021-01-20: qty 10

## 2021-01-20 MED ORDER — OXYCODONE HCL 5 MG PO TABS: 2.5000 mg | ORAL_TABLET | Freq: Four times a day (QID) | ORAL | 0 refills | Status: AC | PRN

## 2021-01-20 MED ORDER — PROPOFOL 10 MG/ML IV BOLUS
60.0000 mg | Freq: Once | INTRAVENOUS | Status: AC
  Administered 2021-01-20: 30 mg via INTRAVENOUS
  Filled 2021-01-20: qty 20

## 2021-01-20 MED ORDER — HYDROMORPHONE HCL 1 MG/ML IJ SOLN
0.5000 mg | Freq: Once | INTRAMUSCULAR | Status: AC
Start: 2021-01-20 — End: 2021-01-20
  Administered 2021-01-20: 0.5 mg via INTRAVENOUS
  Filled 2021-01-20: qty 1

## 2021-01-20 NOTE — ED Triage Notes (Addendum)
Patient arrived with EMS from home , she lost her balance and fell this morning, denies LOC , presents with right ankle swelling/deformity , + ETOH , CBG=107.

## 2021-01-20 NOTE — Discharge Instructions (Addendum)
For pain control you may take at 1000 mg of Tylenol every 8 hours scheduled.  In addition you can take 0.5 to 1 tablet of oxycodone every 6 hours as needed for pain not controlled with the scheduled Tylenol.

## 2021-01-20 NOTE — ED Provider Notes (Signed)
[facility] EMERGENCY DEPARTMENT Provider Note  CSN: [id number] Arrival date & time: 01/20/21 0321  Chief Complaint(s) No chief complaint on file.  HPI Kristi Clay is a 65 y.o. female who presents to the emergency department after mechanical fall down a flight of stairs.  Patient reports that she was drinking last night at her daughter's bachelorette party.  She is staying at a friend's house.  While going down the flight of stairs she lost her balance causing her to fall.  She denied any loss of consciousness.  Denies any headache, neck pain, back pain, chest pain, abdominal pain, hip pain.  She is endorsing severe right ankle aching/throbbing pain.  Worse with movement and palpation.  Alleviated by immobility.  No numbness or tingling.  She denies any other extremity pain.  Patient is not anticoagulated.   HPI  Past Medical History Past Medical History:  Diagnosis Date   Hypertension    LBBB (left bundle branch block)    Patient Active Problem List   Diagnosis Date Noted   Sinusitis 06/26/2010   Lipoma of neck 06/26/2010   HYPOTHYROIDISM, BORDERLINE 03/22/2010   DERMATITIS, SCALP 03/22/2010   MEMORY LOSS 11/28/2008   HEADACHE 07/20/2007   FATIGUE 03/31/2007   ANXIETY 06/11/2006   DEPRESSIVE DISORDER, NOS 06/11/2006   CONSTIPATION 06/11/2006   MENOPAUSAL SYNDROME 06/11/2006   BACK PAIN, LOW 06/11/2006   Home Medication(s) Prior to Admission medications   Medication Sig Start Date End Date Taking? Authorizing Provider  oxyCODONE (ROXICODONE) 5 MG immediate release tablet Take 0.5-1 tablets (2.5-5 mg total) by mouth every 6 (six) hours as needed for up to 5 days for severe pain. 01/20/21 01/25/21 Yes Kaiya Boatman, Grayce Sessions, MD  amLODipine (NORVASC) 2.5 MG tablet Take 1 tablet by mouth as directed.  06/23/18   [provider]  atorvastatin (LIPITOR) 40 MG tablet TAKE 1 TABLET BY MOUTH EVERY DAY 08/21/20   Donato Heinz, MD  buPROPion  (WELLBUTRIN XL) 300 MG 24 hr tablet Take 300 mg by mouth every morning. 01/22/19   [provider]  carvedilol (COREG) 6.25 MG tablet TAKE 1 TABLET BY MOUTH TWICE A DAY 11/02/20   Donato Heinz, MD  cyclobenzaprine (FLEXERIL) 10 MG tablet Take by mouth as directed. 11/24/18   [provider]  hydrochlorothiazide (HYDRODIURIL) 25 MG tablet Take 25 mg by mouth every morning. 04/16/19   [provider]  Lifitegrast Shirley Friar) 5 % SOLN Place 1 drop into both eyes as directed. 01/07/19   [provider]  metoprolol tartrate (LOPRESSOR) 100 MG tablet Take 100 mg (1 tablet) 2 HOURS PRIOR TO CT-HOLD COREG 07/06/19   Donato Heinz, MD  nitroGLYCERIN (NITROSTAT) 0.4 MG SL tablet Place 1 tablet (0.4 mg total) under the tongue every 5 (five) minutes as needed for chest pain. 07/06/19 10/04/19  Donato Heinz, MD  omeprazole (PRILOSEC) 40 MG capsule Take 40 mg by mouth every morning. 01/30/19   [provider]  Past Surgical History Past Surgical History:  Procedure Laterality Date   NASAL SEPTUM SURGERY     Family History No family history on file.  Social History Social History   Tobacco Use   Smoking status: Never   Smokeless tobacco: Never  Substance Use Topics   Alcohol use: Yes   Drug use: No   Allergies Patient has no known allergies.  Review of Systems Review of Systems All other systems are reviewed and are negative for acute change except as noted in the HPI  Physical Exam Vital Signs  I have reviewed the triage vital signs BP (!) 116/59   Pulse 83   Temp 97.6 F (36.4 C) (Oral)   Resp 12   Ht 5\' 6"  (1.676 m)   Wt 85 kg   SpO2 100%   BMI 30.25 kg/m   Physical Exam Constitutional:      General: She is not in acute distress.    Appearance: She is well-developed. She is not  diaphoretic.  HENT:     Head: Normocephalic and atraumatic.     Right Ear: External ear normal.     Left Ear: External ear normal.     Nose: Nose normal.  Eyes:     General: No scleral icterus.       Right eye: No discharge.        Left eye: No discharge.     Conjunctiva/sclera: Conjunctivae normal.     Pupils: Pupils are equal, round, and reactive to light.  Cardiovascular:     Rate and Rhythm: Normal rate and regular rhythm.     Pulses:          Radial pulses are 2+ on the right side and 2+ on the left side.       Dorsalis pedis pulses are 2+ on the right side and 2+ on the left side.     Heart sounds: Normal heart sounds. No murmur heard.   No friction rub. No gallop.  Pulmonary:     Effort: Pulmonary effort is normal. No respiratory distress.     Breath sounds: Normal breath sounds. No stridor. No wheezing.  Abdominal:     General: There is no distension.     Palpations: Abdomen is soft.     Tenderness: There is no abdominal tenderness.  Musculoskeletal:     Cervical back: Normal range of motion and neck supple. No bony tenderness.     Thoracic back: No bony tenderness.     Lumbar back: No bony tenderness.     Right ankle: Swelling and deformity present. No lacerations. Tenderness present. Decreased range of motion. Normal pulse.     Comments: Clavicles stable. Chest stable to AP/Lat compression. Pelvis stable to Lat compression.  No chest or abdominal wall contusion.  Skin:    General: Skin is warm and dry.     Findings: No erythema or rash.  Neurological:     Mental Status: She is alert and oriented to person, place, and time.     Comments: Moving all extremities    ED Results and Treatments Labs (all labs ordered are listed, but only abnormal results are displayed) Labs Reviewed  CBC WITH DIFFERENTIAL/PLATELET - Abnormal; Notable for the following components:      Result Value   WBC 11.2 (*)    Neutro Abs 8.6 (*)    Abs Immature Granulocytes 0.09 (*)    All  other components within normal limits  COMPREHENSIVE METABOLIC PANEL - Abnormal; Notable for  the following components:   Glucose, Bld 113 (*)    Total Bilirubin 0.1 (*)    All other components within normal limits  ETHANOL - Abnormal; Notable for the following components:   Alcohol, Ethyl (B) 196 (*)    All other components within normal limits  PROTIME-INR                                                                                                                         EKG  EKG Interpretation  Date/Time:  Sunday January 20 2021 03:26:37 EDT Ventricular Rate:  76 PR Interval:  163 QRS Duration: 132 QT Interval:  433 QTC Calculation: 487 R Axis:   68 Text Interpretation: Sinus rhythm IVCD, consider atypical LBBB No acute changes Confirmed by Addison Lank [phone]) on 01/20/2021 4:52:08 AM       Radiology DG Ankle Complete Right  Result Date: 01/20/2021 CLINICAL DATA:  65 year old female status post fall. Ankle deformity. EXAM: RIGHT ANKLE - COMPLETE 3+ VIEW COMPARISON:  None. FINDINGS: Lateral fracture dislocation of the ankle. Comminuted and displaced medial and posterior malleolus fractures. There is an 18 mm medial malleolus fragment distal to the tibial plafond. And a large, roughly 23 mm posterior malleolus fragment is suspected projecting above the talar dome on the AP and mortise joints. Comminuted oblique fracture of the distal right fibula metadiaphysis with over riding of 2.6 cm. One full shaft width lateral displacement, and about 40 degrees of lateral angulation of the distal fragment. The talar dome appears intact. Calcaneus appears intact. Other visible bones of the right foot appear intact. IMPRESSION: Lateral fracture dislocation of the right ankle. Trimalleolar comminution and displacement. Talus and calcaneus remain intact. Electronically Signed   By: Genevie Ann M.D.   On: 01/20/2021 04:07    Pertinent labs & imaging results that were available during my care of the  patient were reviewed by me and considered in my medical decision making (see MDM for details).  Medications Ordered in ED Medications  HYDROmorphone (DILAUDID) injection 0.5 mg (0.5 mg Intravenous Given 01/20/21 0424)  propofol (DIPRIVAN) 10 mg/mL bolus/IV push 60 mg (30 mg Intravenous Given 01/20/21 0639)  ketamine 50 mg in normal saline 5 mL (10 mg/mL) syringe (30 mg Intravenous Given 01/20/21 0640)  Procedures .1-3 Lead EKG Interpretation Performed by: Fatima Blank, MD Authorized by: Fatima Blank, MD     Interpretation: normal     ECG rate:  89   ECG rate assessment: normal     Rhythm: sinus rhythm     Ectopy: none     Conduction: normal   .Sedation  Date/Time: 01/20/2021 7:09 AM Performed by: Fatima Blank, MD Authorized by: Fatima Blank, MD   Consent:    Consent obtained:  Written   Consent given by:  Patient   Risks discussed:  Allergic reaction, dysrhythmia, inadequate sedation, prolonged hypoxia resulting in organ damage, prolonged sedation necessitating reversal, respiratory compromise necessitating ventilatory assistance and intubation, vomiting and nausea   Alternatives discussed:  Analgesia without sedation Universal protocol:    Immediately prior to procedure, a time out was called: yes     Patient identity confirmed:  Arm band Indications:    Procedure performed:  Fracture reduction   Procedure necessitating sedation performed by:  Physician performing sedation Pre-sedation assessment:    Time since last food or drink:  0200   ASA classification: class 2 - patient with mild systemic disease     Mouth opening:  2 finger widths   Thyromental distance:  3 finger widths   Mallampati score:  II - soft palate, uvula, fauces visible   Neck mobility: normal     Pre-sedation assessments completed and  reviewed: airway patency, cardiovascular function, hydration status, mental status, nausea/vomiting, pain level, respiratory function and temperature     Pre-sedation assessment completed:  01/20/2021 6:05 AM Immediate pre-procedure details:    Reassessment: Patient reassessed immediately prior to procedure     Reviewed: vital signs, relevant labs/tests and NPO status     Verified: bag valve mask available, emergency equipment available, intubation equipment available, IV patency confirmed, oxygen available and suction available   Procedure details (see MAR for exact dosages):    Preoxygenation:  Nasal cannula   Sedation:  Ketamine and propofol   Intended level of sedation: deep   Intra-procedure monitoring:  Blood pressure monitoring, continuous capnometry, frequent LOC assessments, frequent vital sign checks, continuous pulse oximetry and cardiac monitor   Intra-procedure events: hypoxia     Intra-procedure management:  Supplemental oxygen (increased)   Total Provider sedation time (minutes):  15 Post-procedure details:    Post-sedation assessment completed:  01/20/2021 7:11 AM   Attendance: Constant attendance by certified staff until patient recovered     Recovery: Patient returned to pre-procedure baseline     Post-sedation assessments completed and reviewed: airway patency, cardiovascular function, hydration status, mental status, nausea/vomiting, pain level and respiratory function     Patient is stable for discharge or admission: yes     Procedure completion:  Tolerated well, no immediate complications .Ortho Injury Treatment  Date/Time: 01/20/2021 7:11 AM Performed by: Fatima Blank, MD Authorized by: Fatima Blank, MD   Consent:    Consent obtained:  Written   Consent given by:  Patient   Risks discussed:  Fracture, irreducible dislocation, recurrent dislocation, vascular damage and stiffness   Alternatives discussed:  Alternative treatmentInjury location:  ankle Location details: right ankle Injury type: fracture-dislocation Fracture type: trimalleolar Pre-procedure neurovascular assessment: neurovascularly intact Pre-procedure range of motion: reduced  Patient sedated: Yes. Refer to sedation procedure documentation for details of sedation. Manipulation performed: yes Skeletal traction used: yes Reduction successful: yes X-ray confirmed reduction: yes Immobilization: splint Splint type: ankle stirrup and short leg Splint Applied by: ED Provider and  Ortho Tech Supplies used: Ortho-Glass Post-procedure neurovascular assessment: post-procedure neurovascularly intact Post-procedure distal perfusion: normal Post-procedure neurological function: normal    (including critical care time)  Medical Decision Making / ED Course I have reviewed the nursing notes for this encounter and the patient's prior records (if available in EHR or on provided paperwork).  MACYN SHROPSHIRE was evaluated in Emergency Department on 01/20/2021 for the symptoms described in the history of present illness. She was evaluated in the context of the global COVID-19 pandemic, which necessitated consideration that the patient might be at risk for infection with the SARS-CoV-2 virus that causes COVID-19. Institutional protocols and algorithms that pertain to the evaluation of patients at risk for COVID-19 are in a state of rapid change based on information released by regulatory bodies including the CDC and federal and state organizations. These policies and algorithms were followed during the patient's care in the ED.     Fall down a flight of stairs. Positive EtOH on board. Obvious deformity to the right ankle.  Skin tenting noted on the medial malleolus region. No other injuries noted on exam. Plain film of the right ankle notable for trimalleolar fracture and dislocation, closed. Will obtain a CT head and cervical spine given intoxication.   Pertinent labs &  imaging results that were available during my care of the patient were reviewed by me and considered in my medical decision making:  CT head and cervical spine negative  Patient sedated and fracture/dislocation of the right ankle reduced.  Splinted.  Provided with crutches.  Orthopedic follow-up for definitive management.     Final Clinical Impression(s) / ED Diagnoses Final diagnoses:  Fall in home, initial encounter  Closed trimalleolar fracture of right ankle, initial encounter    The patient appears reasonably screened and/or stabilized for discharge and I doubt any other medical condition or other [facility] requiring further screening, evaluation, or treatment in the ED at this time prior to discharge. Safe for discharge with strict return precautions.  Disposition: Discharge  Condition: Good  I have discussed the results, Dx and Tx plan with the patient/family who expressed understanding and agree(s) with the plan. Discharge instructions discussed at length. The patient/family was given strict return precautions who verbalized understanding of the instructions. No further questions at time of discharge.    ED Discharge Orders          Ordered    oxyCODONE (ROXICODONE) 5 MG immediate release tablet  Every 6 hours PRN        01/20/21 [place] narcotic database reviewed and no active prescriptions noted.   Follow Up: Willaim Sheng, [address] [place] [id number] [phone]  Call  to schedule an appointment for close follow up  Deland Pretty, [address] [place] [address] [phone]  Call  as needed for additional pain management   This chart was dictated using voice recognition software.  Despite best efforts to proofread,  errors can occur which can change the documentation meaning.    Fatima Blank, MD 01/20/21 0800

## 2021-01-20 NOTE — Progress Notes (Signed)
Orthopedic Tech Progress Note Patient Details:  Kristi Clay [birth year] [id number]  Ortho Devices Type of Ortho Device: Post (short leg) splint, Stirrup splint Ortho Device/Splint Location: rle. Ortho Device/Splint Interventions: Ordered, Application, Adjustment  I assisted dr with reduction then applied splint and held the splint in place post mold to let the splint harden. Post Interventions Patient Tolerated: Well Instructions Provided: Care of device, Adjustment of device  Karolee Stamps 01/20/2021, 6:58 AM

## 2021-01-20 NOTE — ED Notes (Addendum)
Patient signed consent form for conscious sedation and manual reduction of right ankle fracture/dislocation by Dr. Leonette Monarch .

## 2021-01-21 ENCOUNTER — Telehealth: Payer: Self-pay | Admitting: *Deleted

## 2021-01-21 DIAGNOSIS — M25571 Pain in right ankle and joints of right foot: Secondary | ICD-10-CM | POA: Diagnosis not present

## 2021-01-21 NOTE — Telephone Encounter (Signed)
   [facility] Pre-operative Risk Assessment    Patient Name: Kristi Clay  DOB: 30-Jan-1956 MRN: [id number]    Request for surgical clearance:  What type of surgery is being performed? ORIF RT ANKLE  When is this surgery scheduled? 01/30/2021  What type of clearance is required (medical clearance vs. Pharmacy clearance to hold med vs. Both)? Medical   Are there any medications that need to be held prior to surgery and how long? N/a  Practice name and name of physician performing surgery? [facility]; Dr Cleda Mccreedy  What is the office phone number? [name]   What is the office fax number?  [phone]  8.   Anesthesia type (None, local, MAC, general) ? unknown   Raiford Simmonds 01/21/2021, 4:53 PM  _________________________________________________________________   (provider comments below)

## 2021-01-22 ENCOUNTER — Ambulatory Visit (HOSPITAL_BASED_OUTPATIENT_CLINIC_OR_DEPARTMENT_OTHER): Payer: BC Managed Care – PPO | Admitting: General Practice

## 2021-01-22 DIAGNOSIS — M25571 Pain in right ankle and joints of right foot: Secondary | ICD-10-CM | POA: Diagnosis not present

## 2021-01-22 NOTE — Progress Notes (Deleted)
Cardiology Office Note:    Date:  01/22/2021   ID:  [name], DOB May 29, 1955, MRN [id number]  PCP:  Deland Pretty, MD   [facility] Providers Cardiologist:  Donato Heinz, MD { Click to update primary MD,subspecialty MD or APP then REFRESH:1}    Referring MD: Deland Pretty, MD   Preoperative cardiac evaluation  History of Present Illness:    Kristi Clay is a 65 y.o. female with a hx of HTN, HLD, and chest discomfort.  Stress echo for chest discomfort at [facility] 2016 showed no evidence of ischemia and normal LV function.  Coronary CTA 08/10/2019 showed a coronary calcium score of 0, normal coronary origin with right dominance, and no evidence of CAD.  Father died of MI at age 1 and had his first MI in his 2s.  Mother had MI in her 52s.  She presented to the emergency department on 07/05/2019 with complaints of chest pain.  Her EKG showed a left bundle branch block.  Her high-sensitivity troponins were 8 and then 12.  She reported having chest discomfort several times per week.  She described the discomfort as chest tightness which was located in the center and left side of her chest.  Her chest discomfort occurred with exertion.  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  During that time she noted chest discomfort when walking from room to room.  Her discomfort resolved with rest.  She denied chest discomfort at [place].  She sustained a mechanical fall at home and presented to the [facility] emergency department on 01/20/2021.  She denied loss of consciousness.  She denied any neck pain, headache, back pain, chest discomfort, abdominal pain, or hip pain.  She noted right ankle aching and throbbing type pain.  Her pain is worse with movement and palpitation.  X-ray showed closed right ankle trimalleolar fracture and dislocation.  Her head CT and cervical spine images were negative.  She presents the clinic today for follow-up evaluation and preoperative cardiac  evaluation.  She states***  *** denies chest pain, shortness of breath, lower extremity edema, fatigue, palpitations, melena, hematuria, hemoptysis, diaphoresis, weakness, presyncope, syncope, orthopnea, and PND.   Past Medical History:  Diagnosis Date   Hypertension    LBBB (left bundle branch block)     Past Surgical History:  Procedure Laterality Date   NASAL SEPTUM SURGERY      Current Medications: No outpatient medications have been marked as taking for the 01/22/21 encounter (Appointment) with Deberah Pelton, NP.     Allergies:   Patient has no known allergies.   Social History   Socioeconomic History   Marital status: Widowed    Spouse name: Not on file   Number of children: Not on file   Years of education: Not on file   Highest education level: Not on file  Occupational History   Not on file  Tobacco Use   Smoking status: Never   Smokeless tobacco: Never  Substance and Sexual Activity   Alcohol use: Yes   Drug use: No   Sexual activity: Not on file  Other Topics Concern   Not on file  Social History Narrative   Not on file   Social Determinants of Health   Financial Resource Strain: Not on file  Food Insecurity: Not on file  Transportation Needs: Not on file  Physical Activity: Not on file  Stress: Not on file  Social Connections: Not on file  Family History: The patient's ***family history is not on file.  ROS:   Please see the history of present illness.    *** All other systems reviewed and are negative.   Risk Assessment/Calculations:   {Does this patient have ATRIAL FIBRILLATION?:[phone]}       Physical Exam:    VS:  There were no vitals taken for this visit.    Wt Readings from Last 3 Encounters:  01/20/21 187 lb 6.3 oz (85 kg)  07/06/19 163 lb (73.9 kg)  07/05/19 150 lb (68 kg)     GEN: *** Well nourished, well developed in no acute distress HEENT: Normal NECK: No JVD; No carotid bruits LYMPHATICS: No  lymphadenopathy CARDIAC: ***RRR, no murmurs, rubs, gallops RESPIRATORY:  Clear to auscultation without rales, wheezing or rhonchi  ABDOMEN: Soft, non-tender, non-distended MUSCULOSKELETAL:  No edema; No deformity  SKIN: Warm and dry NEUROLOGIC:  Alert and oriented x 3 PSYCHIATRIC:  Normal affect    EKGs/Labs/Other Studies Reviewed:    The following studies were reviewed today:  Echocardiogram 07/11/2019 IMPRESSIONS     1. Normal LV systolic function; mild LVH; grade 1 diastolic dysfunction;  mild MR and TR.   2. Left ventricular ejection fraction, by estimation, is 60 to 65%. The  left ventricle has normal function. The left ventricle has no regional  wall motion abnormalities. There is mild left ventricular hypertrophy.  Left ventricular diastolic parameters  are consistent with Grade I diastolic dysfunction (impaired relaxation).   3. Right ventricular systolic function is normal. The right ventricular  size is normal. There is mildly elevated pulmonary artery systolic  pressure.   4. The mitral valve is normal in structure. Mild mitral valve  regurgitation. No evidence of mitral stenosis.   5. The aortic valve is tricuspid. Aortic valve regurgitation is not  visualized. No aortic stenosis is present.   6. The inferior vena cava is normal in size with greater than 50%  respiratory variability, suggesting right atrial pressure of 3 mmHg.  Coronary CTA 08/10/2019  IMPRESSION: 1. Coronary calcium score of 0. This was 0 percentile for age and sex matched control.   2. Normal coronary origin with right dominance.   3. No evidence of CAD.   CAD-RADS 0. No evidence of CAD (0%). Consider non-atherosclerotic causes of chest pain.     Electronically Signed   By: Oswaldo Milian MD   On: 08/10/2019 18:06  EKG:  EKG is *** ordered today.  The ekg ordered today demonstrates ***  Recent Labs: 01/20/2021: ALT 26; BUN 13; Creatinine, Ser 0.92; Hemoglobin 13.3; Platelets  335; Potassium 5.0; Sodium 142  Recent Lipid Panel No results found for: CHOL, TRIG, HDL, CHOLHDL, VLDL, LDLCALC, LDLDIRECT  ASSESSMENT & PLAN    Chest pain-denies recent episodes of arm neck back or chest discomfort.  Underwent coronary CTA 08/10/2019 which showed a coronary calcium score of 0 and no evidence of coronary artery disease. Continue amlodipine, carvedilol Heart healthy low-sodium diet-salty 6 given Increase physical activity as tolerated  Left bundle branch block-EKG today shows***.  Echocardiogram 07/11/2019 showed normal LV EF and G1 DD Continue to monitor  Essential hypertension-BP today***.  Well-controlled at home. Continue HCTZ, amlodipine, carvedilol Heart healthy low-sodium diet-salty 6 given Increase physical activity as tolerated  Hyperlipidemia-LDL***. Continue atorvastatin Heart healthy low-sodium high-fiber diet Increase physical activity as tolerated   Preoperative cardiovascular examination-[facility] orthopedic specialist; Dr. Cleda Mccreedy; ORIF right ankle scheduled for 01/30/2021  Primary Cardiologist: Oswaldo Milian, MD  Chart reviewed as  part of pre-operative protocol coverage. Given past medical history and time since last visit, based on ACC/AHA guidelines, Kristi Clay would be at acceptable risk for the planned procedure without further cardiovascular testing.   Patient was advised that if he/she*** develops new symptoms prior to surgery to contact our office to arrange a follow-up appointment.  He verbalized understanding.  I will route this recommendation to the requesting party via Epic fax function and remove from pre-op pool.  Please call with questions.  {Are you ordering a CV Procedure (e.g. stress test, cath, DCCV, TEE, etc)?   Press F2        :[id number]}    Medication Adjustments/Labs and Tests Ordered: Current medicines are reviewed at length with the patient today.  Concerns regarding medicines are outlined  above.  No orders of the defined types were placed in this encounter.  No orders of the defined types were placed in this encounter.   There are no Patient Instructions on file for this visit.   Signed, Deberah Pelton, NP  01/22/2021 6:52 AM      Notice: This dictation was prepared with Dragon dictation along with smaller phrase technology. Any transcriptional errors that result from this process are unintentional and may not be corrected upon review.  I spent***minutes examining this patient, reviewing medications, and using patient centered shared decision making involving her cardiac care.  Prior to her visit I spent greater than 20 minutes reviewing her past medical history,  medications, and prior cardiac tests.

## 2021-01-22 NOTE — Telephone Encounter (Signed)
    Patient Name: Kristi Clay  DOB: 1955/07/14 MRN: [id number]  Primary Cardiologist: Donato Heinz, MD  Chart reviewed as part of pre-operative protocol coverage. Given past medical history and time since last visit, based on ACC/AHA guidelines, MABELL ESGUERRA would be at acceptable risk for the planned procedure without further cardiovascular testing. I discussed her case with Dr. Gardiner Rhyme, although last appointment was more than 1 year ago, however work up last years shows she has clean coronary arteries and normal pumping function of heart. She has not have heart disease, therefore it is quite reasonable to clear the patient for this low risk surgery.   I will route this recommendation to the requesting party via Epic fax function and remove from pre-op pool.  Please call with questions.  [place], [place] 01/22/2021, 9:18 AM

## 2021-01-22 NOTE — Telephone Encounter (Signed)
I s/w the ot and she is aware that she does not need the appt today with Coletta Memos, FNP for pre op clearance per Almyra Deforest, PAC pre op provider today. Pt thanked me for the call and the help. I have cancelled appt and have faxed clearance to requesting office.

## 2021-01-28 DIAGNOSIS — M25571 Pain in right ankle and joints of right foot: Secondary | ICD-10-CM | POA: Diagnosis not present

## 2021-01-30 ENCOUNTER — Other Ambulatory Visit: Payer: Self-pay

## 2021-01-30 ENCOUNTER — Ambulatory Visit
Admission: RE | Admit: 2021-01-30 | Discharge: 2021-01-30 | Disposition: A | Discharge status: Home / Self Care | Payer: BC Managed Care – PPO | Source: Ambulatory Visit | Attending: Orthopaedic Surgery | Admitting: Orthopaedic Surgery

## 2021-01-30 ENCOUNTER — Ambulatory Visit (HOSPITAL_BASED_OUTPATIENT_CLINIC_OR_DEPARTMENT_OTHER): Admit: 2021-01-30 | Payer: BC Managed Care – PPO | Admitting: Orthopedic Surgery

## 2021-01-30 ENCOUNTER — Encounter (HOSPITAL_BASED_OUTPATIENT_CLINIC_OR_DEPARTMENT_OTHER): Payer: Self-pay

## 2021-01-30 ENCOUNTER — Other Ambulatory Visit: Payer: Self-pay | Admitting: Orthopaedic Surgery

## 2021-01-30 DIAGNOSIS — M7989 Other specified soft tissue disorders: Secondary | ICD-10-CM | POA: Diagnosis not present

## 2021-01-30 DIAGNOSIS — S82841A Displaced bimalleolar fracture of right lower leg, initial encounter for closed fracture: Secondary | ICD-10-CM | POA: Diagnosis not present

## 2021-01-30 DIAGNOSIS — S82851A Displaced trimalleolar fracture of right lower leg, initial encounter for closed fracture: Secondary | ICD-10-CM | POA: Diagnosis not present

## 2021-01-30 DIAGNOSIS — T148XXA Other injury of unspecified body region, initial encounter: Secondary | ICD-10-CM

## 2021-01-30 DIAGNOSIS — Z01818 Encounter for other preprocedural examination: Secondary | ICD-10-CM | POA: Diagnosis not present

## 2021-01-30 DIAGNOSIS — S82831A Other fracture of upper and lower end of right fibula, initial encounter for closed fracture: Secondary | ICD-10-CM | POA: Diagnosis not present

## 2021-01-30 SURGERY — OPEN REDUCTION INTERNAL FIXATION (ORIF) ANKLE FRACTURE
Anesthesia: Choice | Site: Ankle | Laterality: Right

## 2021-01-31 ENCOUNTER — Encounter (HOSPITAL_BASED_OUTPATIENT_CLINIC_OR_DEPARTMENT_OTHER): Payer: Self-pay | Admitting: Orthopaedic Surgery

## 2021-01-31 ENCOUNTER — Other Ambulatory Visit: Payer: Self-pay

## 2021-01-31 ENCOUNTER — Other Ambulatory Visit: Payer: Self-pay | Admitting: Orthopaedic Surgery

## 2021-02-04 ENCOUNTER — Encounter (HOSPITAL_BASED_OUTPATIENT_CLINIC_OR_DEPARTMENT_OTHER): Payer: Self-pay | Admitting: Orthopaedic Surgery

## 2021-02-04 NOTE — Anesthesia Preprocedure Evaluation (Addendum)
Anesthesia Evaluation  Patient identified by MRN, date of birth, ID band Patient awake    Reviewed: Allergy & Precautions, NPO status , Patient's Chart, lab work & pertinent test results, reviewed documented beta blocker date and time   Airway Mallampati: II       Dental no notable dental hx.    Pulmonary neg pulmonary ROS,    Pulmonary exam normal breath sounds clear to auscultation       Cardiovascular hypertension, Pt. on medications and Pt. on home beta blockers Normal cardiovascular exam Rhythm:Regular Rate:Normal  EKG 01/28/21 NSR, LBBB pattern  Echo 07/11/19 1. Normal LV systolic function; mild LVH; grade 1 diastolic dysfunction; mild MR and TR.  2. Left ventricular ejection fraction, by estimation, is 60 to 65%. The left ventricle has normal function. The left ventricle has no regional wall motion abnormalities. There is mild left ventricular hypertrophy. Left ventricular diastolic parameters are consistent with Grade I diastolic dysfunction (impaired relaxation).  3. Right ventricular systolic function is normal. The right ventricular size is normal. There is mildly elevated pulmonary artery systolic pressure.  4. The mitral valve is normal in structure. Mild mitral valve  regurgitation. No evidence of mitral stenosis.  5. The aortic valve is tricuspid. Aortic valve regurgitation is not visualized. No aortic stenosis is present.  6. The inferior vena cava is normal in size with greater than 50% respiratory variability, suggesting right atrial pressure of 3 mmHg.    Neuro/Psych  Headaches, PSYCHIATRIC DISORDERS Anxiety Depression    GI/Hepatic Neg liver ROS, GERD  Medicated and Controlled,  Endo/Other  Hypothyroidism Obesity Hyperlipidemia  Renal/GU negative Renal ROS  negative genitourinary   Musculoskeletal Right distal tibia/fibula Fx   Abdominal (+) + obese,   Peds  Hematology negative hematology  ROS (+)   Anesthesia Other Findings   Reproductive/Obstetrics                           Anesthesia Physical Anesthesia Plan  ASA: 3  Anesthesia Plan: General   Post-op Pain Management:  Regional for Post-op pain   Induction: Intravenous  PONV Risk Score and Plan: 4 or greater and Treatment may vary due to age or medical condition and Ondansetron  Airway Management Planned: LMA  Additional Equipment:   Intra-op Plan:   Post-operative Plan: Extubation in OR  Informed Consent: I have reviewed the patients History and Physical, chart, labs and discussed the procedure including the risks, benefits and alternatives for the proposed anesthesia with the patient or authorized representative who has indicated his/her understanding and acceptance.     Dental advisory given  Plan Discussed with: CRNA and Anesthesiologist  Anesthesia Plan Comments:        Anesthesia Quick Evaluation

## 2021-02-05 ENCOUNTER — Ambulatory Visit (HOSPITAL_BASED_OUTPATIENT_CLINIC_OR_DEPARTMENT_OTHER): Payer: BC Managed Care – PPO | Admitting: Anesthesiology

## 2021-02-05 ENCOUNTER — Encounter (HOSPITAL_BASED_OUTPATIENT_CLINIC_OR_DEPARTMENT_OTHER): Payer: Self-pay | Admitting: Orthopaedic Surgery

## 2021-02-05 ENCOUNTER — Encounter (HOSPITAL_BASED_OUTPATIENT_CLINIC_OR_DEPARTMENT_OTHER)
Admission: RE | Disposition: A | Discharge status: Home / Self Care | Payer: Self-pay | Source: Home / Self Care | Attending: Orthopaedic Surgery

## 2021-02-05 ENCOUNTER — Other Ambulatory Visit: Payer: Self-pay

## 2021-02-05 ENCOUNTER — Ambulatory Visit (HOSPITAL_BASED_OUTPATIENT_CLINIC_OR_DEPARTMENT_OTHER)
Admission: RE | Admit: 2021-02-05 | Discharge: 2021-02-05 | Disposition: A | Discharge status: Home / Self Care | Payer: BC Managed Care – PPO | Attending: Orthopaedic Surgery | Admitting: Orthopaedic Surgery

## 2021-02-05 ENCOUNTER — Ambulatory Visit (HOSPITAL_COMMUNITY): Payer: BC Managed Care – PPO

## 2021-02-05 DIAGNOSIS — I10 Essential (primary) hypertension: Secondary | ICD-10-CM | POA: Diagnosis not present

## 2021-02-05 DIAGNOSIS — X58XXXA Exposure to other specified factors, initial encounter: Secondary | ICD-10-CM | POA: Diagnosis not present

## 2021-02-05 DIAGNOSIS — S82841A Displaced bimalleolar fracture of right lower leg, initial encounter for closed fracture: Secondary | ICD-10-CM | POA: Diagnosis not present

## 2021-02-05 DIAGNOSIS — S8261XD Displaced fracture of lateral malleolus of right fibula, subsequent encounter for closed fracture with routine healing: Secondary | ICD-10-CM | POA: Diagnosis not present

## 2021-02-05 DIAGNOSIS — G8918 Other acute postprocedural pain: Secondary | ICD-10-CM | POA: Diagnosis not present

## 2021-02-05 DIAGNOSIS — S82301A Unspecified fracture of lower end of right tibia, initial encounter for closed fracture: Secondary | ICD-10-CM | POA: Insufficient documentation

## 2021-02-05 DIAGNOSIS — S82391A Other fracture of lower end of right tibia, initial encounter for closed fracture: Secondary | ICD-10-CM | POA: Diagnosis not present

## 2021-02-05 DIAGNOSIS — Z79899 Other long term (current) drug therapy: Secondary | ICD-10-CM | POA: Insufficient documentation

## 2021-02-05 DIAGNOSIS — S93439A Sprain of tibiofibular ligament of unspecified ankle, initial encounter: Secondary | ICD-10-CM | POA: Diagnosis not present

## 2021-02-05 DIAGNOSIS — I447 Left bundle-branch block, unspecified: Secondary | ICD-10-CM | POA: Diagnosis not present

## 2021-02-05 DIAGNOSIS — S82831A Other fracture of upper and lower end of right fibula, initial encounter for closed fracture: Secondary | ICD-10-CM | POA: Insufficient documentation

## 2021-02-05 DIAGNOSIS — Z419 Encounter for procedure for purposes other than remedying health state, unspecified: Secondary | ICD-10-CM

## 2021-02-05 HISTORY — DX: Hyperlipidemia, unspecified: E78.5

## 2021-02-05 HISTORY — PX: ORIF FIBULA FRACTURE: SHX5114

## 2021-02-05 SURGERY — OPEN REDUCTION INTERNAL FIXATION (ORIF) FIBULA FRACTURE
Anesthesia: General | Site: Ankle | Laterality: Right

## 2021-02-05 MED ORDER — BUPIVACAINE HCL (PF) 0.5 % IJ SOLN
INTRAMUSCULAR | Status: AC
  Filled 2021-02-05: qty 30

## 2021-02-05 MED ORDER — BUPIVACAINE-EPINEPHRINE (PF) 0.5% -1:200000 IJ SOLN
INTRAMUSCULAR | Status: AC
  Filled 2021-02-05: qty 30

## 2021-02-05 MED ORDER — EPHEDRINE 5 MG/ML INJ
INTRAVENOUS | Status: AC
  Filled 2021-02-05: qty 5

## 2021-02-05 MED ORDER — EPHEDRINE SULFATE-NACL 50-0.9 MG/10ML-% IV SOSY
PREFILLED_SYRINGE | INTRAVENOUS | Status: DC | PRN
  Administered 2021-02-05 (×5): 5 mg via INTRAVENOUS

## 2021-02-05 MED ORDER — LACTATED RINGERS IV SOLN: INTRAVENOUS | Status: DC | PRN

## 2021-02-05 MED ORDER — ONDANSETRON HCL 4 MG/2ML IJ SOLN
INTRAMUSCULAR | Status: DC | PRN
  Administered 2021-02-05: 4 mg via INTRAVENOUS

## 2021-02-05 MED ORDER — OXYCODONE HCL 5 MG PO TABS: 5.0000 mg | ORAL_TABLET | Freq: Once | ORAL | Status: DC | PRN

## 2021-02-05 MED ORDER — DEXAMETHASONE SODIUM PHOSPHATE 10 MG/ML IJ SOLN
INTRAMUSCULAR | Status: AC
  Filled 2021-02-05: qty 1

## 2021-02-05 MED ORDER — DEXAMETHASONE SODIUM PHOSPHATE 10 MG/ML IJ SOLN
INTRAMUSCULAR | Status: DC | PRN
  Administered 2021-02-05: 5 mg via INTRAVENOUS

## 2021-02-05 MED ORDER — MIDAZOLAM HCL 2 MG/2ML IJ SOLN
2.0000 mg | Freq: Once | INTRAMUSCULAR | Status: AC
  Administered 2021-02-05: 2 mg via INTRAVENOUS

## 2021-02-05 MED ORDER — LIDOCAINE 2% (20 MG/ML) 5 ML SYRINGE
INTRAMUSCULAR | Status: DC | PRN
  Administered 2021-02-05: 60 mg via INTRAVENOUS

## 2021-02-05 MED ORDER — ONDANSETRON HCL 4 MG/2ML IJ SOLN
INTRAMUSCULAR | Status: AC
  Filled 2021-02-05: qty 2

## 2021-02-05 MED ORDER — PHENYLEPHRINE 40 MCG/ML (10ML) SYRINGE FOR IV PUSH (FOR BLOOD PRESSURE SUPPORT)
PREFILLED_SYRINGE | INTRAVENOUS | Status: AC
  Filled 2021-02-05: qty 10

## 2021-02-05 MED ORDER — FENTANYL CITRATE (PF) 100 MCG/2ML IJ SOLN: 25.0000 ug | INTRAMUSCULAR | Status: DC | PRN

## 2021-02-05 MED ORDER — BUPIVACAINE HCL (PF) 0.25 % IJ SOLN
INTRAMUSCULAR | Status: AC
  Filled 2021-02-05: qty 30

## 2021-02-05 MED ORDER — PROPOFOL 10 MG/ML IV BOLUS
INTRAVENOUS | Status: AC
  Filled 2021-02-05: qty 20

## 2021-02-05 MED ORDER — FENTANYL CITRATE (PF) 100 MCG/2ML IJ SOLN
INTRAMUSCULAR | Status: DC | PRN
  Administered 2021-02-05: 25 ug via INTRAVENOUS

## 2021-02-05 MED ORDER — MIDAZOLAM HCL 2 MG/2ML IJ SOLN
INTRAMUSCULAR | Status: AC
  Filled 2021-02-05: qty 2

## 2021-02-05 MED ORDER — PHENYLEPHRINE HCL (PRESSORS) 10 MG/ML IV SOLN
INTRAVENOUS | Status: AC
  Filled 2021-02-05: qty 2

## 2021-02-05 MED ORDER — PHENYLEPHRINE HCL-NACL 20-0.9 MG/250ML-% IV SOLN
INTRAVENOUS | Status: DC | PRN
  Administered 2021-02-05: 25 ug/min via INTRAVENOUS

## 2021-02-05 MED ORDER — CEFAZOLIN SODIUM-DEXTROSE 2-4 GM/100ML-% IV SOLN
2.0000 g | INTRAVENOUS | Status: AC
  Administered 2021-02-05: 2 g via INTRAVENOUS

## 2021-02-05 MED ORDER — 0.9 % SODIUM CHLORIDE (POUR BTL) OPTIME
TOPICAL | Status: DC | PRN
  Administered 2021-02-05: 1000 mL

## 2021-02-05 MED ORDER — OXYCODONE HCL 5 MG PO TABS: 5.0000 mg | ORAL_TABLET | ORAL | 0 refills | Status: AC | PRN

## 2021-02-05 MED ORDER — PHENYLEPHRINE 40 MCG/ML (10ML) SYRINGE FOR IV PUSH (FOR BLOOD PRESSURE SUPPORT)
PREFILLED_SYRINGE | INTRAVENOUS | Status: DC | PRN
  Administered 2021-02-05 (×3): 40 ug via INTRAVENOUS

## 2021-02-05 MED ORDER — PROPOFOL 10 MG/ML IV BOLUS
INTRAVENOUS | Status: AC
  Filled 2021-02-05: qty 40

## 2021-02-05 MED ORDER — LACTATED RINGERS IV SOLN: INTRAVENOUS | Status: DC

## 2021-02-05 MED ORDER — FENTANYL CITRATE (PF) 100 MCG/2ML IJ SOLN
50.0000 ug | Freq: Once | INTRAMUSCULAR | Status: AC
  Administered 2021-02-05: 50 ug via INTRAVENOUS

## 2021-02-05 MED ORDER — FENTANYL CITRATE (PF) 100 MCG/2ML IJ SOLN
INTRAMUSCULAR | Status: AC
  Filled 2021-02-05: qty 2

## 2021-02-05 MED ORDER — ONDANSETRON HCL 4 MG/2ML IJ SOLN: 4.0000 mg | Freq: Once | INTRAMUSCULAR | Status: DC | PRN

## 2021-02-05 MED ORDER — PROPOFOL 10 MG/ML IV BOLUS
INTRAVENOUS | Status: DC | PRN
  Administered 2021-02-05: 140 mg via INTRAVENOUS

## 2021-02-05 MED ORDER — ROPIVACAINE HCL 5 MG/ML IJ SOLN
INTRAMUSCULAR | Status: DC | PRN
  Administered 2021-02-05: 20 mL via PERINEURAL

## 2021-02-05 MED ORDER — ASPIRIN 325 MG PO TABS: 325.0000 mg | ORAL_TABLET | Freq: Every day | ORAL | 0 refills | Status: AC

## 2021-02-05 MED ORDER — OXYCODONE HCL 5 MG/5ML PO SOLN: 5.0000 mg | Freq: Once | ORAL | Status: DC | PRN

## 2021-02-05 MED ORDER — CEFAZOLIN SODIUM-DEXTROSE 2-4 GM/100ML-% IV SOLN
INTRAVENOUS | Status: AC
  Filled 2021-02-05: qty 100

## 2021-02-05 MED ORDER — LIDOCAINE 2% (20 MG/ML) 5 ML SYRINGE
INTRAMUSCULAR | Status: AC
  Filled 2021-02-05: qty 5

## 2021-02-05 MED ORDER — BUPIVACAINE-EPINEPHRINE (PF) 0.5% -1:200000 IJ SOLN
INTRAMUSCULAR | Status: DC | PRN
  Administered 2021-02-05: 25 mL via PERINEURAL

## 2021-02-05 SURGICAL SUPPLY — 80 items
APL PRP STRL LF DISP 70% ISPRP (MISCELLANEOUS) ×1
APL SKNCLS STERI-STRIP NONHPOA (GAUZE/BANDAGES/DRESSINGS)
BANDAGE ESMARK 6X9 LF (GAUZE/BANDAGES/DRESSINGS) ×1 IMPLANT
BENZOIN TINCTURE PRP APPL 2/3 (GAUZE/BANDAGES/DRESSINGS) IMPLANT
BIT DRILL 2 CANN GRADUATED (BIT) ×4 IMPLANT
BIT DRILL 2.5 CANN STRL (BIT) ×2 IMPLANT
BIT DRILL 2.6 CANN (BIT) ×2 IMPLANT
BIT DRILL 3 CANN ENDOSCOPIC (BIT) ×2 IMPLANT
BLADE SURG 15 STRL LF DISP TIS (BLADE) ×2 IMPLANT
BLADE SURG 15 STRL SS (BLADE) ×4
BNDG CMPR 9X6 STRL LF SNTH (GAUZE/BANDAGES/DRESSINGS) ×1
BNDG COHESIVE 4X5 TAN ST LF (GAUZE/BANDAGES/DRESSINGS) IMPLANT
BNDG ELASTIC 6X5.8 VLCR STR LF (GAUZE/BANDAGES/DRESSINGS) ×4 IMPLANT
BNDG ESMARK 6X9 LF (GAUZE/BANDAGES/DRESSINGS) ×2
CHLORAPREP W/TINT 26 (MISCELLANEOUS) ×2 IMPLANT
COVER BACK TABLE 60X90IN (DRAPES) ×2 IMPLANT
CUFF TOURN SGL QUICK 34 (TOURNIQUET CUFF)
CUFF TRNQT CYL 34X4.125X (TOURNIQUET CUFF) IMPLANT
DECANTER SPIKE VIAL GLASS SM (MISCELLANEOUS) IMPLANT
DRAPE C-ARM 42X72 X-RAY (DRAPES) ×2 IMPLANT
DRAPE C-ARMOR (DRAPES) ×2 IMPLANT
DRAPE EXTREMITY T 121X128X90 (DISPOSABLE) ×2 IMPLANT
DRAPE IMP U-DRAPE 54X76 (DRAPES) ×2 IMPLANT
DRAPE U-SHAPE 47X51 STRL (DRAPES) ×2 IMPLANT
ELECT REM PT RETURN 9FT ADLT (ELECTROSURGICAL) ×2
ELECTRODE REM PT RTRN 9FT ADLT (ELECTROSURGICAL) ×1 IMPLANT
GAUZE SPONGE 4X4 12PLY STRL (GAUZE/BANDAGES/DRESSINGS) ×2 IMPLANT
GAUZE XEROFORM 1X8 LF (GAUZE/BANDAGES/DRESSINGS) ×2 IMPLANT
GLOVE SRG 8 PF TXTR STRL LF DI (GLOVE) ×1 IMPLANT
GLOVE SURG ENC TEXT LTX SZ7.5 (GLOVE) ×2 IMPLANT
GLOVE SURG UNDER POLY LF SZ8 (GLOVE) ×2
GOWN STRL REUS W/ TWL LRG LVL3 (GOWN DISPOSABLE) ×1 IMPLANT
GOWN STRL REUS W/ TWL XL LVL3 (GOWN DISPOSABLE) ×1 IMPLANT
GOWN STRL REUS W/TWL LRG LVL3 (GOWN DISPOSABLE) ×2
GOWN STRL REUS W/TWL XL LVL3 (GOWN DISPOSABLE) ×2
GUIDEWIRE 1.35MM (WIRE) ×4 IMPLANT
K-WIRE BB-TAK (WIRE) ×2
KWIRE BB-TAK (WIRE) ×1 IMPLANT
NS IRRIG 1000ML POUR BTL (IV SOLUTION) ×2 IMPLANT
PACK BASIN DAY SURGERY FS (CUSTOM PROCEDURE TRAY) ×2 IMPLANT
PAD CAST 4YDX4 CTTN HI CHSV (CAST SUPPLIES) ×1 IMPLANT
PADDING CAST COTTON 4X4 STRL (CAST SUPPLIES) ×2
PADDING CAST SYNTHETIC 4 (CAST SUPPLIES) ×3
PADDING CAST SYNTHETIC 4X4 STR (CAST SUPPLIES) ×3 IMPLANT
PENCIL SMOKE EVACUATOR (MISCELLANEOUS) ×2 IMPLANT
PLATE LOCK 61 MED HOOK 3H (Plate) ×2 IMPLANT
PLATE LOCK DIST FIB RT 5H (Plate) ×2 IMPLANT
SCREW CORTICAL 3MMX16MM (Screw) ×2 IMPLANT
SCREW LO-PRO TI 3.5X16MM (Screw) ×2 IMPLANT
SCREW LOCK 18X3XVALOPRFL (Screw) ×2 IMPLANT
SCREW LOCK VAL 3X26 (Screw) ×2 IMPLANT
SCREW LOCKING 3.0X18 (Screw) ×4 IMPLANT
SCREW LOCKING VARIABLE 3.0X16 (Screw) ×2 IMPLANT
SCREW LP CORT 3.0X20 (Screw) ×2 IMPLANT
SCREW LP TI 3.5X14MM (Screw) ×4 IMPLANT
SCREW LP TIT 3.5X30 (Screw) ×2 IMPLANT
SCREW LP TIT 3.5X34 (Screw) ×2 IMPLANT
SCREW LP TITA 3.5X46 (Screw) ×2 IMPLANT
SCREW QCKFIX CANN 4.0X40MM (Screw) ×2 IMPLANT
SHEET MEDIUM DRAPE 40X70 STRL (DRAPES) ×2 IMPLANT
SLEEVE SCD COMPRESS KNEE MED (STOCKING) ×2 IMPLANT
SPLINT FAST PLASTER 5X30 (CAST SUPPLIES) ×20
SPLINT PLASTER CAST FAST 5X30 (CAST SUPPLIES) ×20 IMPLANT
SPONGE T-LAP 18X18 ~~LOC~~+RFID (SPONGE) IMPLANT
STAPLER VISISTAT 35W (STAPLE) IMPLANT
STOCKINETTE 6  STRL (DRAPES) ×2
STOCKINETTE 6 STRL (DRAPES) ×1 IMPLANT
STRIP CLOSURE SKIN 1/2X4 (GAUZE/BANDAGES/DRESSINGS) IMPLANT
SUCTION FRAZIER HANDLE 10FR (MISCELLANEOUS) ×2
SUCTION TUBE FRAZIER 10FR DISP (MISCELLANEOUS) ×1 IMPLANT
SUT ETHILON 3 0 PS 1 (SUTURE) ×2 IMPLANT
SUT MNCRL AB 3-0 PS2 18 (SUTURE) ×8 IMPLANT
SUT PDS AB 2-0 CT2 27 (SUTURE) ×2 IMPLANT
SUT VIC AB 2-0 SH 27 (SUTURE)
SUT VIC AB 2-0 SH 27XBRD (SUTURE) IMPLANT
SUT VIC AB 3-0 FS2 27 (SUTURE) IMPLANT
SYR BULB EAR ULCER 3OZ GRN STR (SYRINGE) ×2 IMPLANT
TOWEL GREEN STERILE FF (TOWEL DISPOSABLE) ×4 IMPLANT
TUBE CONNECTING 20X1/4 (TUBING) ×2 IMPLANT
UNDERPAD 30X36 HEAVY ABSORB (UNDERPADS AND DIAPERS) ×2 IMPLANT

## 2021-02-05 NOTE — Anesthesia Procedure Notes (Addendum)
Anesthesia Regional Block: Popliteal block   Pre-Anesthetic Checklist: , timeout performed,  Correct Patient, Correct Site, Correct Laterality,  Correct Procedure, Correct Position, site marked,  Risks and benefits discussed,  Surgical consent,  Pre-op evaluation,  At surgeon's request and post-op pain management  Laterality: Right  Prep: chloraprep       Needles:  Injection technique: Single-shot  Needle Type: Echogenic Stimulator Needle     Needle Length: 10cm  Needle Gauge: 21   Needle insertion depth: 5 cm   Additional Needles:   Procedures:,,,, ultrasound used (permanent image in chart),,    Narrative:  Start time: 02/05/2021 7:18 AM End time: 02/05/2021 7:23 AM Injection made incrementally with aspirations every 5 mL.  Performed by: Personally  Anesthesiologist: Josephine Igo, MD  Additional Notes: Timeout performed. Patient sedated. Relevant anatomy ID'd using [place]. Incremental 2-36ml injection of LA with frequent aspiration. Patient tolerated procedure well.    Right Popliteal Block

## 2021-02-05 NOTE — Transfer of Care (Signed)
Immediate Anesthesia Transfer of Care Note  Patient: Kristi Clay  Procedure(s) Performed: OPEN TREATMENT OF RIGHT INTRA-ARTICULAR DISTAL TIBIA FRACTURE WITH FIBULAR FIXATION, (Right: Ankle)  Patient Location: PACU  Anesthesia Type:General and Regional  Level of Consciousness: drowsy  Airway & Oxygen Therapy: Patient Spontanous Breathing and Patient connected to face mask oxygen  Post-op Assessment: Report given to RN and Post -op Vital signs reviewed and stable  Post vital signs: Reviewed and stable  Last Vitals:  Vitals Value Taken Time  BP 130/57 02/05/21 0921  Temp    Pulse 54 02/05/21 0921  Resp 17 02/05/21 0921  SpO2 96 % 02/05/21 0921  Vitals shown include unvalidated device data.  Last Pain:  Vitals:   02/05/21 0637  TempSrc: Oral  PainSc: 6       Patients Stated Pain Goal: 6 (63/49/49 4473)  Complications: No notable events documented.

## 2021-02-05 NOTE — H&P (Signed)
PREOPERATIVE H&P  Chief Complaint: Right intra-articular distal tibia fracture with fibula fracture  HPI: Kristi Clay is a 65 y.o. female who presents for preoperative history and physical with a diagnosis of right intra-articular distal tibia and fibula fracture.  She sustained this after a fall possibly 2 weeks ago.  There was significant comminution on the medial side as well as laterally about the tibial plafond and the lateral malleolus.  She was seen in the emergency department and placed in a splint.  She is here today for surgery. Symptoms are rated as moderate to severe, and have been worsening.  This is significantly impairing activities of daily living.  She has elected for surgical management.   Past Medical History:  Diagnosis Date   Hyperlipemia    Hypertension    LBBB (left bundle branch block)    Past Surgical History:  Procedure Laterality Date   NASAL SEPTUM SURGERY     Social History   Socioeconomic History   Marital status: Widowed    Spouse name: Not on file   Number of children: Not on file   Years of education: Not on file   Highest education level: Not on file  Occupational History   Not on file  Tobacco Use   Smoking status: Never   Smokeless tobacco: Never  Substance and Sexual Activity   Alcohol use: Yes    Comment: occ   Drug use: No   Sexual activity: Not on file  Other Topics Concern   Not on file  Social History Narrative   Not on file   Social Determinants of Health   Financial Resource Strain: Not on file  Food Insecurity: Not on file  Transportation Needs: Not on file  Physical Activity: Not on file  Stress: Not on file  Social Connections: Not on file   History reviewed. No pertinent family history. No Known Allergies Prior to Admission medications   Medication Sig Start Date End Date Taking? Authorizing Provider  atorvastatin (LIPITOR) 40 MG tablet TAKE 1 TABLET BY MOUTH EVERY DAY 08/21/20  Yes Donato Heinz, MD   carvedilol (COREG) 6.25 MG tablet TAKE 1 TABLET BY MOUTH TWICE A DAY 11/02/20  Yes Donato Heinz, MD  cholecalciferol (VITAMIN D3) 25 MCG (1000 UNIT) tablet Take 1,000 Units by mouth daily.   Yes [provider]  omeprazole (PRILOSEC) 40 MG capsule Take 40 mg by mouth every morning. 01/30/19  Yes [provider]  vitamin B-12 (CYANOCOBALAMIN) 500 MCG tablet Take 500 mcg by mouth daily.   Yes [provider]  amLODipine (NORVASC) 2.5 MG tablet Take 1 tablet by mouth as directed.  06/23/18   [provider]  cyclobenzaprine (FLEXERIL) 10 MG tablet Take by mouth as directed. 11/24/18   [provider]  hydrochlorothiazide (HYDRODIURIL) 25 MG tablet Take 25 mg by mouth every morning. 04/16/19   [provider]  Lifitegrast Shirley Friar) 5 % SOLN Place 1 drop into both eyes as directed. 01/07/19   [provider]  metoprolol tartrate (LOPRESSOR) 100 MG tablet Take 100 mg (1 tablet) 2 HOURS PRIOR TO CT-HOLD COREG 07/06/19   Donato Heinz, MD  nitroGLYCERIN (NITROSTAT) 0.4 MG SL tablet Place 1 tablet (0.4 mg total) under the tongue every 5 (five) minutes as needed for chest pain. 07/06/19 10/04/19  Donato Heinz, MD     Positive ROS: All other systems have been reviewed and were otherwise negative with the exception of those mentioned in the HPI and  as above.  Physical Exam:  Vitals:   02/05/21 0637  BP: 134/62  Pulse: 63  Resp: 14  Temp: 98.4 F (36.9 C)  SpO2: 98%   General: Alert, no acute distress Cardiovascular: No pedal edema Respiratory: No cyanosis, no use of accessory musculature GI: No organomegaly, abdomen is soft and non-tender Skin: No lesions in the area of chief complaint Neurologic: Sensation intact distally Psychiatric: Patient is competent for consent with normal mood and affect Lymphatic: No axillary or cervical lymphadenopathy  MUSCULOSKELETAL: Right ankle in a short leg splint.  Swelling  is down.  Foot exposed is warm and well-perfused.  She endorses sensation light touch about the foot.  No tenderness proximal to the splint.  Assessment: Right intra-articular distal tibia with associated fibula fracture, comminuted   Plan: Plan for open treatment of her injuries.  Possible syndesmosis..  We discussed the risks, benefits and alternatives of surgery which include but are not limited to wound healing complications, infection, nonunion, malunion, need for further surgery, damage to surrounding structures and continued pain.  They understand there is no guarantees to an acceptable outcome.  After weighing these risks they opted to proceed with surgery.     Erle Crocker, MD    02/05/2021 7:21 AM

## 2021-02-05 NOTE — Anesthesia Procedure Notes (Signed)
Procedure Name: LMA Insertion Date/Time: 02/05/2021 7:45 AM Performed by: Janene Harvey, CRNA Pre-anesthesia Checklist: Patient identified, Emergency Drugs available, Suction available and Patient being monitored Patient Re-evaluated:Patient Re-evaluated prior to induction Oxygen Delivery Method: Circle system utilized Preoxygenation: Pre-oxygenation with 100% oxygen Induction Type: IV induction Ventilation: Mask ventilation without difficulty LMA: LMA inserted LMA Size: 4.0 Placement Confirmation: positive ETCO2 Dental Injury: Teeth and Oropharynx as per pre-operative assessment

## 2021-02-05 NOTE — Progress Notes (Signed)
Assisted Dr. Royce Macadamia with right, ultrasound guided, popliteal/saphenous block. Side rails up, monitors on throughout procedure. See vital signs in flow sheet. Tolerated Procedure well.

## 2021-02-05 NOTE — Anesthesia Postprocedure Evaluation (Signed)
Anesthesia Post Note  Patient: AMANADA PHILBRICK  Procedure(s) Performed: OPEN TREATMENT OF RIGHT INTRA-ARTICULAR DISTAL TIBIA FRACTURE WITH FIBULAR FIXATION, (Right: Ankle)     Patient location during evaluation: PACU Anesthesia Type: General Level of consciousness: awake and alert and oriented Pain management: pain level controlled Vital Signs Assessment: post-procedure vital signs reviewed and stable Respiratory status: spontaneous breathing, nonlabored ventilation and respiratory function stable Cardiovascular status: blood pressure returned to baseline and stable Postop Assessment: no apparent nausea or vomiting Anesthetic complications: no   No notable events documented.  Last Vitals:  Vitals:   02/05/21 0945 02/05/21 1015  BP: 125/74 (!) 139/57  Pulse: 71 70  Resp: (!) 21 16  Temp:  36.6 C  SpO2: 95% 96%    Last Pain:  Vitals:   02/05/21 1015  TempSrc:   PainSc: 0-No pain                 Lashan Macias A.

## 2021-02-05 NOTE — Addendum Note (Signed)
Addendum  created 02/05/21 1334 by Janene Harvey, CRNA   Charge Capture section accepted

## 2021-02-05 NOTE — Anesthesia Procedure Notes (Addendum)
Anesthesia Regional Block: Adductor canal block   Pre-Anesthetic Checklist: , timeout performed,  Correct Patient, Correct Site, Correct Laterality,  Correct Procedure, Correct Position, site marked,  Risks and benefits discussed,  Surgical consent,  Pre-op evaluation,  At surgeon's request and post-op pain management  Laterality: Right  Prep: chloraprep       Needles:  Injection technique: Single-shot  Needle Type: Echogenic Stimulator Needle     Needle Length: 10cm  Needle Gauge: 21   Needle insertion depth: 6 cm   Additional Needles:   Procedures:,,,, ultrasound used (permanent image in chart),,    Narrative:  Start time: 02/05/2021 7:24 AM End time: 02/05/2021 7:28 AM Injection made incrementally with aspirations every 5 mL.  Performed by: Personally  Anesthesiologist: Josephine Igo, MD  Additional Notes: Timeout performed. Patient sedated. Relevant anatomy ID'd using [place]. Incremental 2-87ml injection of LA with frequent aspiration. Patient tolerated procedure well.    Right Adductor Canal Block

## 2021-02-05 NOTE — Discharge Instructions (Addendum)
[facility] POST-OP INSTRUCTIONS   Pain Management The numbing medicine and your leg will last around 18 hours, take a dose of your pain medicine as soon as you feel it wearing off to avoid rebound pain. Keep your foot elevated above heart level.  Make sure that your heel hangs free ('floats'). Take all prescribed medication as directed. If taking narcotic pain medication you may want to use an over-the-counter stool softener to avoid constipation. You may take over-the-counter NSAIDs (ibuprofen, naproxen, etc.) as well as over-the-counter acetaminophen as directed on the packaging as a supplement for your pain and may also use it to wean away from the prescription medication.  Activity Non-weightbearing Keep splint intact  First Postoperative Visit Your first postop visit will be at least 2 weeks after surgery.  This should be scheduled when you schedule surgery. If you do not have a postoperative visit scheduled please call [phone] to schedule an appointment. At the appointment your incision will be evaluated for suture removal, x-rays will be obtained if necessary.  General Instructions Swelling is very common after foot and ankle surgery.  It often takes 3 months for the foot and ankle to begin to feel comfortable.  Some amount of swelling will persist for 6-12 months. DO NOT change the dressing.  If there is a problem with the dressing (too tight, loose, gets wet, etc.) please contact Dr. Adair's office. DO NOT get the dressing wet.  For showers you can use an over-the-counter cast cover or wrap a washcloth around the top of your dressing and then cover it with a plastic bag and tape it to your leg. DO NOT soak the incision (no tubs, pools, bath, etc.) until you have approval from Dr. Adair.  Contact Dr. Adairs office or go to Emergency Room if: Temperature above 101 F. Increasing pain that is unresponsive to pain medication or elevation Excessive redness or  swelling in your foot Dressing problems - excessive bloody drainage, looseness or tightness, or if dressing gets wet Develop pain, swelling, warmth, or discoloration of your calf   Post Anesthesia Home Care Instructions  Activity: Get plenty of rest for the remainder of the day. A responsible individual must stay with you for 24 hours following the procedure.  For the next 24 hours, DO NOT: -Drive a car -Operate machinery -Drink alcoholic beverages -Take any medication unless instructed by your physician -Make any legal decisions or sign important papers.  Meals: Start with liquid foods such as gelatin or soup. Progress to regular foods as tolerated. Avoid greasy, spicy, heavy foods. If nausea and/or vomiting occur, drink only clear liquids until the nausea and/or vomiting subsides. Call your physician if vomiting continues.  Special Instructions/Symptoms: Your throat may feel dry or sore from the anesthesia or the breathing tube placed in your throat during surgery. If this causes discomfort, gargle with warm salt water. The discomfort should disappear within 24 hours.  If you had a scopolamine patch placed behind your ear for the management of post- operative nausea and/or vomiting:  1. The medication in the patch is effective for 72 hours, after which it should be removed.  Wrap patch in a tissue and discard in the trash. Wash hands thoroughly with soap and water. 2. You may remove the patch earlier than 72 hours if you experience unpleasant side effects which may include dry mouth, dizziness or visual disturbances. 3. Avoid touching the patch. Wash your hands with soap and water after contact with the   patch.  Regional Anesthesia Blocks  1. Numbness or the inability to move the "blocked" extremity may last from 3-48 hours after placement. The length of time depends on the medication injected and your individual response to the medication. If the numbness is not going away after 48  hours, call your surgeon.  2. The extremity that is blocked will need to be protected until the numbness is gone and the  Strength has returned. Because you cannot feel it, you will need to take extra care to avoid injury. Because it may be weak, you may have difficulty moving it or using it. You may not know what position it is in without looking at it while the block is in effect.  3. For blocks in the legs and feet, returning to weight bearing and walking needs to be done carefully. You will need to wait until the numbness is entirely gone and the strength has returned. You should be able to move your leg and foot normally before you try and bear weight or walk. You will need someone to be with you when you first try to ensure you do not fall and possibly risk injury.  4. Bruising and tenderness at the needle site are common side effects and will resolve in a few days.  5. Persistent numbness or new problems with movement should be communicated to the surgeon or the [facility] ([phone])/ [facility] ([phone]).      

## 2021-02-06 ENCOUNTER — Ambulatory Visit: Payer: BC Managed Care – PPO | Admitting: Internal Medicine

## 2021-02-07 ENCOUNTER — Encounter (HOSPITAL_BASED_OUTPATIENT_CLINIC_OR_DEPARTMENT_OTHER): Payer: Self-pay | Admitting: Orthopaedic Surgery

## 2021-02-07 NOTE — Addendum Note (Signed)
Addendum  created 02/07/21 0950 by Lavonia Dana, CRNA   Charge Capture section accepted

## 2021-02-08 NOTE — Op Note (Signed)
Fransico Meadow female 65 y.o. 02/05/2021  PreOperative Diagnosis: Right intra-articular distal tibia fracture with fibular fracture  PostOperative Diagnosis: Right intra-articular distal tibia fracture with fibula fracture Syndesmosis disruption  PROCEDURE: Open treatment of intra-articular distal tibia fracture with fibula fixation Open treatment of syndesmosis Ankle stress view fluoroscopy  SURGEON: Melony Overly, MD  ASSISTANT: none  ANESTHESIA: General LMA with peripheral nerve block  FINDINGS: See below  IMPLANTS: Arthrex medial hook plate, distal fibular locking plate, 3.5 mm fully threaded screws  INDICATIONS:65 y.o. femalesustained the above injury after a fall.  She had significant intra-articular component to her distal tibia fracture medially and posteriorly as well as posterior laterally.  CT scan revealed these injuries.  She also had fracturing of the fibula and possible disruption of her syndesmosis.  Given the instability pattern of her fracture and the nature of the overall injury she was indicated for surgery.   Patient understood the risks, benefits and alternatives to surgery which include but are not limited to wound healing complications, infection, nonunion, malunion, need for further surgery as well as damage to surrounding structures. They also understood the potential for continued pain in that there were no guarantees of acceptable outcome After weighing these risks the patient opted to proceed with surgery.  PROCEDURE: Patient was identified in the preoperative holding area.  The right ankle was marked by myself.  Consent was signed by myself and the patient.  Block was performed by anesthesia in the preoperative holding area.  Patient was taken to the operative suite and placed supine on the operative table.  General LMA anesthesia was induced without difficulty. Bump was placed under the operative hip and bone foam was used.  All bony prominences  were well padded.  Tourniquet was placed on the operative thigh.  Preoperative antibiotics were given. The extremity was prepped and draped in the usual sterile fashion and surgical timeout was performed.  The limb was elevated and the tourniquet was inflated to 250 mmHg.  We began by making a longitudinal incision overlying the distal fibula.  This was taken sharply down through skin and subcutaneous tissue.  Blunt dissection was used to identify any branch of the superficial peroneal nerve which was not identified within the surgical field.  The incision was then taken sharply down to bone and the fracture site was identified.  The fracture site was mobilized.   The fracture site were cleaned with a rondure and curette of any fracture hematoma and callus formation.  Then the fracture of the fibula was reduced under direct visualization and held provisionally with a lobster claw.  Then fluoroscopy confirmed adequate reduction of the ankle mortise at that time.  Then a combination of locking and nonlocking screws were used after placement of a lag screw across the fracture by technique.  This provided good stability of the distal fibula fracture.    We then turned our attention to the tibia.  An incision was made along the medial border of the tibia.  This was taken sharply down through skin and subcutaneous tissue.  Blunt dissection was used to mobilize skin flaps.  Then the medial aspect of the tibia was identified and the incision was carried down to bone.  Subperiosteal flaps were created about the medial aspect of the tibia.  There is significant comminution of the tibial fracture.  The dissection was carried posteriorly to the fracture site.  Anteriorly we are able to gain access to the anterior aspect of the tibiotalar joint to  aid with appropriate reduction of the joint surfaces.  There was free-floating cartilage and bony fragments within the joint that were irrigated out and removed with a rongeur.   The joint was inspected and there was no obvious damage to the talus cartilage surfaces of what we could see.  Then Valora Corporal was used to mobilize the fracture fragments.  There was significant amount of comminution along the distal tibial fracture.  The fracture was further mobilized and the hematoma and callus formation tissue was removed.  Then the fracture was reduced under direct visualization and held provisionally with K wire fixation.  A medial hook/buttress plate was placed to fixate the fracture.  It was placed and confirmed arthroscopy to be appropriate.  Then a combination of locking and nonlocking screws were used to fixate the distal tibia fracture.  We confirmed appropriate reduction of the joint surfaces on fluoroscopy and under direct visualization.  We then performed ankle stress view fluoroscopy.  The ankle was stressed under fluoroscopic view and found to be unstable with regard to the syndesmosis.  Due to this we turned our attention back to the lateral ankle.  Through a separate deep incision were able to gain access to the syndesmosis.  There was disruption of the ligaments anteriorly.  The syndesmosis was reduced under direct visualization held provisionally with a Weber clamp.  Then a single quadra cortical screw was placed across the syndesmosis to fixate the syndesmosis.  We then repeated stress view fluoroscopy and the ankle and syndesmosis was found to be stable.  Final fluoroscopic images were obtained. The wounds were irrigated and the subcuticular tissue was closed with 3-0 Monocryl and the skin with staples.  Xeroform was placed on the wounds as well as 4 x 4's and sterile sheet cotton.  The tourniquet was released.    Patient was placed in a nonweightbearing short leg splint.  Patient tolerated the procedure well.  There were no complications.  Patient was awakened from anesthesia and taken recovery in stable condition.  POST OPERATIVE INSTRUCTIONS: Nonweightbearing on  operative extremity Keep splint dry and limb elevated Continue 325 mg aspirin for DVT prophylaxis Call the office with concerns Follow-up in 2 weeks for splint removal, x-rays of the operative ankle, nonweightbearing and suture removal if appropriate.   TOURNIQUET TIME:less than 2 hours  BLOOD LOSS:  less than 50 mL         DRAINS: none         SPECIMEN: none       COMPLICATIONS:  * No complications entered in OR log *         Disposition: PACU - hemodynamically stable.         Condition: stable

## 2021-02-20 DIAGNOSIS — S82841A Displaced bimalleolar fracture of right lower leg, initial encounter for closed fracture: Secondary | ICD-10-CM | POA: Diagnosis not present

## 2021-03-22 DIAGNOSIS — S82841D Displaced bimalleolar fracture of right lower leg, subsequent encounter for closed fracture with routine healing: Secondary | ICD-10-CM | POA: Diagnosis not present

## 2021-03-22 DIAGNOSIS — Z9889 Other specified postprocedural states: Secondary | ICD-10-CM | POA: Diagnosis not present

## 2021-04-19 DIAGNOSIS — M25571 Pain in right ankle and joints of right foot: Secondary | ICD-10-CM | POA: Diagnosis not present

## 2021-04-19 DIAGNOSIS — M25671 Stiffness of right ankle, not elsewhere classified: Secondary | ICD-10-CM | POA: Diagnosis not present

## 2021-05-03 DIAGNOSIS — M25571 Pain in right ankle and joints of right foot: Secondary | ICD-10-CM | POA: Diagnosis not present

## 2021-05-03 DIAGNOSIS — S82841A Displaced bimalleolar fracture of right lower leg, initial encounter for closed fracture: Secondary | ICD-10-CM | POA: Diagnosis not present

## 2021-05-03 DIAGNOSIS — M25671 Stiffness of right ankle, not elsewhere classified: Secondary | ICD-10-CM | POA: Diagnosis not present

## 2021-05-10 DIAGNOSIS — M25571 Pain in right ankle and joints of right foot: Secondary | ICD-10-CM | POA: Diagnosis not present

## 2021-05-10 DIAGNOSIS — M25671 Stiffness of right ankle, not elsewhere classified: Secondary | ICD-10-CM | POA: Diagnosis not present

## 2021-05-16 DIAGNOSIS — M25671 Stiffness of right ankle, not elsewhere classified: Secondary | ICD-10-CM | POA: Diagnosis not present

## 2021-05-16 DIAGNOSIS — M25571 Pain in right ankle and joints of right foot: Secondary | ICD-10-CM | POA: Diagnosis not present

## 2021-05-18 ENCOUNTER — Other Ambulatory Visit: Payer: Self-pay | Admitting: Cardiology

## 2021-05-21 DIAGNOSIS — M25671 Stiffness of right ankle, not elsewhere classified: Secondary | ICD-10-CM | POA: Diagnosis not present

## 2021-05-21 DIAGNOSIS — M25571 Pain in right ankle and joints of right foot: Secondary | ICD-10-CM | POA: Diagnosis not present

## 2021-05-23 DIAGNOSIS — M25671 Stiffness of right ankle, not elsewhere classified: Secondary | ICD-10-CM | POA: Diagnosis not present

## 2021-05-23 DIAGNOSIS — M25571 Pain in right ankle and joints of right foot: Secondary | ICD-10-CM | POA: Diagnosis not present

## 2021-05-28 DIAGNOSIS — M25571 Pain in right ankle and joints of right foot: Secondary | ICD-10-CM | POA: Diagnosis not present

## 2021-05-28 DIAGNOSIS — M25671 Stiffness of right ankle, not elsewhere classified: Secondary | ICD-10-CM | POA: Diagnosis not present

## 2021-05-30 DIAGNOSIS — M25571 Pain in right ankle and joints of right foot: Secondary | ICD-10-CM | POA: Diagnosis not present

## 2021-05-30 DIAGNOSIS — M25671 Stiffness of right ankle, not elsewhere classified: Secondary | ICD-10-CM | POA: Diagnosis not present

## 2021-06-14 DIAGNOSIS — S82841D Displaced bimalleolar fracture of right lower leg, subsequent encounter for closed fracture with routine healing: Secondary | ICD-10-CM | POA: Diagnosis not present

## 2021-06-14 DIAGNOSIS — T8484XA Pain due to internal orthopedic prosthetic devices, implants and grafts, initial encounter: Secondary | ICD-10-CM | POA: Diagnosis not present

## 2021-06-27 DIAGNOSIS — G8918 Other acute postprocedural pain: Secondary | ICD-10-CM | POA: Diagnosis not present

## 2021-06-27 DIAGNOSIS — T8484XA Pain due to internal orthopedic prosthetic devices, implants and grafts, initial encounter: Secondary | ICD-10-CM | POA: Diagnosis not present

## 2021-08-08 ENCOUNTER — Other Ambulatory Visit: Payer: Self-pay | Admitting: Cardiology

## 2021-09-17 ENCOUNTER — Other Ambulatory Visit: Payer: Self-pay | Admitting: Cardiology

## 2021-10-20 ENCOUNTER — Other Ambulatory Visit: Payer: Self-pay | Admitting: Cardiology

## 2021-11-09 ENCOUNTER — Other Ambulatory Visit: Payer: Self-pay | Admitting: Cardiology

## 2021-11-23 ENCOUNTER — Other Ambulatory Visit: Payer: Self-pay | Admitting: Cardiology

## 2021-12-07 ENCOUNTER — Other Ambulatory Visit: Payer: Self-pay | Admitting: Cardiology

## 2021-12-17 DIAGNOSIS — E559 Vitamin D deficiency, unspecified: Secondary | ICD-10-CM | POA: Diagnosis not present

## 2021-12-17 DIAGNOSIS — Z0000 Encounter for general adult medical examination without abnormal findings: Secondary | ICD-10-CM | POA: Diagnosis not present

## 2021-12-17 DIAGNOSIS — R748 Abnormal levels of other serum enzymes: Secondary | ICD-10-CM | POA: Diagnosis not present

## 2021-12-17 DIAGNOSIS — E7800 Pure hypercholesterolemia, unspecified: Secondary | ICD-10-CM | POA: Diagnosis not present

## 2021-12-24 DIAGNOSIS — Z0000 Encounter for general adult medical examination without abnormal findings: Secondary | ICD-10-CM | POA: Diagnosis not present

## 2021-12-24 DIAGNOSIS — R748 Abnormal levels of other serum enzymes: Secondary | ICD-10-CM | POA: Diagnosis not present

## 2021-12-24 DIAGNOSIS — E7800 Pure hypercholesterolemia, unspecified: Secondary | ICD-10-CM | POA: Diagnosis not present

## 2021-12-24 DIAGNOSIS — H6121 Impacted cerumen, right ear: Secondary | ICD-10-CM | POA: Diagnosis not present

## 2021-12-24 DIAGNOSIS — E559 Vitamin D deficiency, unspecified: Secondary | ICD-10-CM | POA: Diagnosis not present

## 2021-12-24 DIAGNOSIS — Z8249 Family history of ischemic heart disease and other diseases of the circulatory system: Secondary | ICD-10-CM | POA: Diagnosis not present

## 2021-12-24 DIAGNOSIS — N3000 Acute cystitis without hematuria: Secondary | ICD-10-CM | POA: Diagnosis not present

## 2021-12-31 ENCOUNTER — Other Ambulatory Visit: Payer: Self-pay | Admitting: Registered Nurse

## 2021-12-31 DIAGNOSIS — R748 Abnormal levels of other serum enzymes: Secondary | ICD-10-CM

## 2021-12-31 DIAGNOSIS — E7800 Pure hypercholesterolemia, unspecified: Secondary | ICD-10-CM

## 2022-01-10 ENCOUNTER — Ambulatory Visit
Admission: RE | Admit: 2022-01-10 | Discharge: 2022-01-10 | Disposition: A | Discharge status: Home / Self Care | Payer: No Typology Code available for payment source | Source: Ambulatory Visit | Attending: Registered Nurse | Admitting: Registered Nurse

## 2022-01-10 DIAGNOSIS — E7800 Pure hypercholesterolemia, unspecified: Secondary | ICD-10-CM

## 2022-01-13 ENCOUNTER — Other Ambulatory Visit: Payer: Self-pay | Admitting: Cardiology

## 2022-01-14 ENCOUNTER — Ambulatory Visit
Admission: RE | Admit: 2022-01-14 | Discharge: 2022-01-14 | Disposition: A | Discharge status: Home / Self Care | Payer: Medicare Other | Source: Ambulatory Visit | Attending: Registered Nurse | Admitting: Registered Nurse

## 2022-01-14 DIAGNOSIS — R748 Abnormal levels of other serum enzymes: Secondary | ICD-10-CM | POA: Diagnosis not present

## 2022-01-14 DIAGNOSIS — K760 Fatty (change of) liver, not elsewhere classified: Secondary | ICD-10-CM | POA: Diagnosis not present

## 2022-01-14 DIAGNOSIS — Z23 Encounter for immunization: Secondary | ICD-10-CM | POA: Diagnosis not present

## 2022-01-14 DIAGNOSIS — N281 Cyst of kidney, acquired: Secondary | ICD-10-CM | POA: Diagnosis not present

## 2022-02-28 ENCOUNTER — Encounter: Payer: Self-pay | Admitting: Internal Medicine

## 2022-04-16 ENCOUNTER — Encounter: Payer: Self-pay | Admitting: Internal Medicine

## 2022-04-16 ENCOUNTER — Ambulatory Visit (INDEPENDENT_AMBULATORY_CARE_PROVIDER_SITE_OTHER): Payer: PPO | Admitting: Internal Medicine

## 2022-04-16 VITALS — BP 146/82 | HR 65 | Ht 63.0 in | Wt 167.1 lb

## 2022-04-16 DIAGNOSIS — R1310 Dysphagia, unspecified: Secondary | ICD-10-CM | POA: Diagnosis not present

## 2022-04-16 DIAGNOSIS — Z1211 Encounter for screening for malignant neoplasm of colon: Secondary | ICD-10-CM

## 2022-04-16 DIAGNOSIS — K219 Gastro-esophageal reflux disease without esophagitis: Secondary | ICD-10-CM

## 2022-04-16 NOTE — Progress Notes (Signed)
Chief Complaint: Dysphagia  HPI : 67 year old female with history of HFpEF, headache, HTN presents with dysphagia  She has had dysphagia for the last 2 years. This has been progressively worsening over time. Endorses dysphagia to solids and liquids. The dysphagia occurs in the bottom of her neck. When food gets stuck, she will sometimes vomit it back up. She is scared to take pills because she worries that she will choke. Denies hematemesis or coffee ground emesis. Denies prior EGD. Endorses nausea and bloating. Denies ab pain, hematochezia, melena, or diarrhea. Endorses chronic constipation. She was started on omeprazole (she took it for a month), which did not help. Mother had colon polyps removed. Endorses chest burning. Denies regurgitation. Her last colonoscopy was years ago, and she thinks it was normal. She is not sure when she is due for a follow up colonoscopy. 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 Last 3 Encounters:  04/16/22 167 lb 2 oz (75.8 kg)  02/05/21 187 lb 6.3 oz (85 kg)  01/20/21 187 lb 6.3 oz (85 kg)   Past Medical History:  Diagnosis Date   Depression    Hyperlipemia    Hypertension    LBBB (left bundle branch block)    Reaction to chronic stress    Past Surgical History:  Procedure Laterality Date   NASAL SEPTUM SURGERY     ORIF FIBULA FRACTURE Right 02/05/2021   Procedure: OPEN TREATMENT OF RIGHT INTRA-ARTICULAR DISTAL TIBIA FRACTURE WITH FIBULAR FIXATION,;  Surgeon: Erle Crocker, MD;  Location: [facility];  Service: Orthopedics;  Laterality: Right;  LENGTH OF SURGERY: 59 MINUTES   Family History  Problem Relation Age of Onset   Colon polyps Mother    Heart disease Mother    Diabetes Sister    Heart disease Maternal Grandmother    Heart disease Maternal Grandfather    Heart disease Paternal Grandmother    Heart disease Paternal Grandfather    Social History   Tobacco Use   Smoking status:  Never   Smokeless tobacco: Never  Substance Use Topics   Alcohol use: Yes    Comment: occ   Drug use: No   Current Outpatient Medications  Medication Sig Dispense Refill   carvedilol (COREG) 6.25 MG tablet TAKE 1 TABLET BY MOUTH 2 (TWO) TIMES DAILY WITH A MEAL. SCHEDULE OFFICE VISIT FOR FUTURE REFILLS. 30 tablet 2   amLODipine (NORVASC) 2.5 MG tablet Take 1 tablet by mouth as directed.  (Patient not taking: Reported on 04/16/2022)     atorvastatin (LIPITOR) 40 MG tablet TAKE 1 TABLET BY MOUTH EVERY DAY (Patient not taking: Reported on 04/16/2022) 90 tablet 0   cholecalciferol (VITAMIN D3) 25 MCG (1000 UNIT) tablet Take 1,000 Units by mouth daily. (Patient not taking: Reported on 04/16/2022)     cyclobenzaprine (FLEXERIL) 10 MG tablet Take by mouth as directed. (Patient not taking: Reported on 04/16/2022)     hydrochlorothiazide (HYDRODIURIL) 25 MG tablet Take 25 mg by mouth every morning. (Patient not taking: Reported on 04/16/2022)     Lifitegrast (XIIDRA) 5 % SOLN Place 1 drop into both eyes as directed. (Patient not taking: Reported on 04/16/2022)     metoprolol tartrate (LOPRESSOR) 100 MG tablet Take 100 mg (1 tablet) 2 HOURS PRIOR TO CT-HOLD COREG (Patient not taking: Reported on 04/16/2022) 1 tablet 0   nitroGLYCERIN (NITROSTAT) 0.4 MG SL tablet Place 1 tablet (0.4 mg total) under the tongue every 5 (  five) minutes as needed for chest pain. 25 tablet 3   omeprazole (PRILOSEC) 40 MG capsule Take 40 mg by mouth every morning. (Patient not taking: Reported on 04/16/2022)     vitamin B-12 (CYANOCOBALAMIN) 500 MCG tablet Take 500 mcg by mouth daily. (Patient not taking: Reported on 04/16/2022)     No current facility-administered medications for this visit.   No Known Allergies   Review of Systems: All systems reviewed and negative except where noted in HPI.   Physical Exam: BP (!) 146/82   Pulse 65   Ht '5\' 3"'$  (1.6 m)   Wt 167 lb 2 oz (75.8 kg)   BMI 29.60 kg/m  Constitutional:  Pleasant,well-developed, female in no acute distress. HEENT: Normocephalic and atraumatic. Conjunctivae are normal. No scleral icterus. Cardiovascular: Normal rate, regular rhythm.  Pulmonary/chest: Effort normal and breath sounds normal. No wheezing, rales or rhonchi. Abdominal: Soft, nondistended, nontender. Bowel sounds active throughout. There are no masses palpable. No hepatomegaly. Extremities: No edema Neurological: Alert and oriented to person place and time. Skin: Skin is warm and dry. No rashes noted. Psychiatric: Normal mood and affect. Behavior is normal.  Labs 01/2021: CBC with mildly elevated WBC of 11.2. CMP unremarkable. INR nml. Ethanol elevated at 196.   Ab U/S 01/15/22: IMPRESSION: 1. Diffuse increased echotexture of the liver. This is nonspecific but can be seen in fatty infiltration of liver. 2. No acute abnormality identified.  ASSESSMENT AND PLAN: Dysphagia GERD Colon cancer screening Patient presents with dysphagia that has been progressively worsening for the last 2 years. Will plan for an EGD for further evaluation to evaluate for an anatomic source of her dysphagia. Will also give her a GERD handout since she does have some chest burning. Patient is likely due for colon cancer screening, but she would like to focus on her dysphagia for now so she declined to schedule a colonoscopy at this time. - GERD handout - EGD [facility] tomorrow at 11 AM - Will plan to defer for colonoscopy for colon cancer screening for now. Can re-consider in the future  Christia Reading, MD  I spent 52 minutes of time, including in depth chart review, independent review of results as outlined above, communicating results with the patient directly, face-to-face time with the patient, coordinating care, ordering studies and medications as appropriate, and documentation.

## 2022-04-16 NOTE — Patient Instructions (Signed)
  If you are age 66 or older, your body mass index should be between 23-30. Your Body mass index is 29.6 kg/m. If this is out of the aforementioned range listed, please consider follow up with your Primary Care Provider.  If you are age 19 or younger, your body mass index should be between 19-25. Your Body mass index is 29.6 kg/m. If this is out of the aformentioned range listed, please consider follow up with your Primary Care Provider.   ________________________________________________________  The [facility] providers would like to encourage you to use [facility] to communicate with providers for non-urgent requests or questions.  Due to long hold times on the telephone, sending your provider a message by [facility] may be a faster and more efficient way to get a response.  Please allow 48 business hours for a response.  Please remember that this is for non-urgent requests.   You have been scheduled for an endoscopy. Please follow written instructions given to you at your visit today. If you use inhalers (even only as needed), please bring them with you on the day of your procedure.   Due to recent changes in healthcare laws, you may see the results of your imaging and laboratory studies on MyChart before your provider has had a chance to review them.  We understand that in some cases there may be results that are confusing or concerning to you. Not all laboratory results come back in the same time frame and the provider may be waiting for multiple results in order to interpret others.  Please give [place] 48 hours in order for your provider to thoroughly review all the results before contacting the office for clarification of your results.    Thank you for entrusting me with your care and choosing [facility].  Dr Lorenso Courier

## 2022-04-17 ENCOUNTER — Ambulatory Visit (AMBULATORY_SURGERY_CENTER): Payer: PPO | Admitting: Internal Medicine

## 2022-04-17 ENCOUNTER — Other Ambulatory Visit: Payer: Self-pay

## 2022-04-17 ENCOUNTER — Encounter: Payer: Self-pay | Admitting: Internal Medicine

## 2022-04-17 ENCOUNTER — Telehealth: Payer: Self-pay

## 2022-04-17 VITALS — BP 126/60 | HR 58 | Temp 97.3°F | Resp 16 | Ht 63.0 in | Wt 167.0 lb

## 2022-04-17 DIAGNOSIS — K2980 Duodenitis without bleeding: Secondary | ICD-10-CM

## 2022-04-17 DIAGNOSIS — R1310 Dysphagia, unspecified: Secondary | ICD-10-CM | POA: Diagnosis not present

## 2022-04-17 DIAGNOSIS — K222 Esophageal obstruction: Secondary | ICD-10-CM

## 2022-04-17 DIAGNOSIS — K315 Obstruction of duodenum: Secondary | ICD-10-CM

## 2022-04-17 DIAGNOSIS — K269 Duodenal ulcer, unspecified as acute or chronic, without hemorrhage or perforation: Secondary | ICD-10-CM

## 2022-04-17 DIAGNOSIS — K2270 Barrett's esophagus without dysplasia: Secondary | ICD-10-CM

## 2022-04-17 DIAGNOSIS — K3189 Other diseases of stomach and duodenum: Secondary | ICD-10-CM | POA: Diagnosis not present

## 2022-04-17 DIAGNOSIS — K449 Diaphragmatic hernia without obstruction or gangrene: Secondary | ICD-10-CM

## 2022-04-17 DIAGNOSIS — F32A Depression, unspecified: Secondary | ICD-10-CM | POA: Diagnosis not present

## 2022-04-17 MED ORDER — SODIUM CHLORIDE 0.9 % IV SOLN: 500.0000 mL | Freq: Once | INTRAVENOUS | Status: AC

## 2022-04-17 MED ORDER — OMEPRAZOLE 40 MG PO CPDR: 40.0000 mg | DELAYED_RELEASE_CAPSULE | Freq: Every day | ORAL | 3 refills | Status: DC

## 2022-04-17 NOTE — Progress Notes (Signed)
GASTROENTEROLOGY PROCEDURE H&P NOTE   Primary Care Physician: Deland Pretty, MD    Reason for Procedure:   Dysphagia, GERD  Plan:    EGD  Patient is appropriate for endoscopic procedure(s) in the ambulatory ([facility]) setting.  The nature of the procedure, as well as the risks, benefits, and alternatives were carefully and thoroughly reviewed with the patient. Ample time for discussion and questions allowed. The patient understood, was satisfied, and agreed to proceed.     HPI: Kristi Clay is a 67 y.o. female who presents for EGD for evaluation of dysphagia and GERD.  Patient was most recently seen in the Gastroenterology Clinic on 04/16/22.  No interval change in medical history since that appointment. Please refer to that note for full details regarding GI history and clinical presentation.   Past Medical History:  Diagnosis Date   Depression    Hyperlipemia    Hypertension    LBBB (left bundle branch block)    Reaction to chronic stress     Past Surgical History:  Procedure Laterality Date   NASAL SEPTUM SURGERY     ORIF FIBULA FRACTURE Right 02/05/2021   Procedure: OPEN TREATMENT OF RIGHT INTRA-ARTICULAR DISTAL TIBIA FRACTURE WITH FIBULAR FIXATION,;  Surgeon: Erle Crocker, MD;  Location: [facility];  Service: Orthopedics;  Laterality: Right;  LENGTH OF SURGERY: 120 MINUTES    Prior to Admission medications   Medication Sig Start Date End Date Taking? Authorizing Provider  amLODipine (NORVASC) 2.5 MG tablet Take 1 tablet by mouth as directed.  Patient not taking: Reported on 04/16/2022 06/23/18   [provider]  atorvastatin (LIPITOR) 40 MG tablet TAKE 1 TABLET BY MOUTH EVERY DAY Patient not taking: Reported on 04/16/2022 05/20/21   Donato Heinz, MD  carvedilol (COREG) 6.25 MG tablet TAKE 1 TABLET BY MOUTH 2 (TWO) TIMES DAILY WITH A MEAL. SCHEDULE OFFICE VISIT FOR FUTURE REFILLS. 12/09/21   Donato Heinz, MD   cholecalciferol (VITAMIN D3) 25 MCG (1000 UNIT) tablet Take 1,000 Units by mouth daily. Patient not taking: Reported on 04/16/2022    [provider]  cyclobenzaprine (FLEXERIL) 10 MG tablet Take by mouth as directed. Patient not taking: Reported on 04/16/2022 11/24/18   [provider]  hydrochlorothiazide (HYDRODIURIL) 25 MG tablet Take 25 mg by mouth every morning. Patient not taking: Reported on 04/16/2022 04/16/19   [provider]  Lifitegrast Shirley Friar) 5 % SOLN Place 1 drop into both eyes as directed. Patient not taking: Reported on 04/16/2022 01/07/19   [provider]  metoprolol tartrate (LOPRESSOR) 100 MG tablet Take 100 mg (1 tablet) 2 HOURS PRIOR TO CT-HOLD COREG Patient not taking: Reported on 04/16/2022 07/06/19   Donato Heinz, MD  nitroGLYCERIN (NITROSTAT) 0.4 MG SL tablet Place 1 tablet (0.4 mg total) under the tongue every 5 (five) minutes as needed for chest pain. 07/06/19 10/04/19  Donato Heinz, MD  omeprazole (PRILOSEC) 40 MG capsule Take 40 mg by mouth every morning. Patient not taking: Reported on 04/16/2022 01/30/19   [provider]  vitamin B-12 (CYANOCOBALAMIN) 500 MCG tablet Take 500 mcg by mouth daily. Patient not taking: Reported on 04/16/2022    [provider]    Current Outpatient Medications  Medication Sig Dispense Refill   amLODipine (NORVASC) 2.5 MG tablet Take 1 tablet by mouth as directed.  (Patient not taking: Reported on 04/16/2022)     atorvastatin (LIPITOR) 40 MG tablet TAKE 1 TABLET BY MOUTH EVERY DAY (Patient  not taking: Reported on 04/16/2022) 90 tablet 0   carvedilol (COREG) 6.25 MG tablet TAKE 1 TABLET BY MOUTH 2 (TWO) TIMES DAILY WITH A MEAL. SCHEDULE OFFICE VISIT FOR FUTURE REFILLS. 30 tablet 2   cholecalciferol (VITAMIN D3) 25 MCG (1000 UNIT) tablet Take 1,000 Units by mouth daily. (Patient not taking: Reported on 04/16/2022)     cyclobenzaprine (FLEXERIL) 10 MG tablet Take by mouth as  directed. (Patient not taking: Reported on 04/16/2022)     hydrochlorothiazide (HYDRODIURIL) 25 MG tablet Take 25 mg by mouth every morning. (Patient not taking: Reported on 04/16/2022)     Lifitegrast (XIIDRA) 5 % SOLN Place 1 drop into both eyes as directed. (Patient not taking: Reported on 04/16/2022)     metoprolol tartrate (LOPRESSOR) 100 MG tablet Take 100 mg (1 tablet) 2 HOURS PRIOR TO CT-HOLD COREG (Patient not taking: Reported on 04/16/2022) 1 tablet 0   nitroGLYCERIN (NITROSTAT) 0.4 MG SL tablet Place 1 tablet (0.4 mg total) under the tongue every 5 (five) minutes as needed for chest pain. 25 tablet 3   omeprazole (PRILOSEC) 40 MG capsule Take 40 mg by mouth every morning. (Patient not taking: Reported on 04/16/2022)     vitamin B-12 (CYANOCOBALAMIN) 500 MCG tablet Take 500 mcg by mouth daily. (Patient not taking: Reported on 04/16/2022)     No current facility-administered medications for this visit.    Allergies as of 04/17/2022   (No Known Allergies)    Family History  Problem Relation Age of Onset   Colon polyps Mother    Heart disease Mother    Diabetes Sister    Heart disease Maternal Grandmother    Heart disease Maternal Grandfather    Heart disease Paternal Grandmother    Heart disease Paternal Grandfather     Social History   Socioeconomic History   Marital status: Widowed    Spouse name: Not on file   Number of children: 3   Years of education: Not on file   Highest education level: Not on file  Occupational History   Occupation: CMA  Tobacco Use   Smoking status: Never   Smokeless tobacco: Never  Substance and Sexual Activity   Alcohol use: Yes    Comment: occ   Drug use: No   Sexual activity: Not on file  Other Topics Concern   Not on file  Social History Narrative   Not on file   Social Determinants of Health   Financial Resource Strain: Not on file  Food Insecurity: Not on file  Transportation Needs: Not on file  Physical Activity: Not on file   Stress: Not on file  Social Connections: Not on file  Intimate Partner Violence: Not on file    Physical Exam: Vital signs in last 24 hours: BP (!) 176/76   Pulse 62   Temp (!) 97.3 F (36.3 C) (Skin)   Ht '5\' 3"'$  (1.6 m)   Wt 167 lb (75.8 kg)   SpO2 96%   BMI 29.58 kg/m  GEN: NAD EYE: Sclerae anicteric ENT: MMM CV: Non-tachycardic Pulm: No increased WOB GI: Soft NEURO:  Alert & Oriented   Christia Reading, MD [facility]   04/17/2022 10:49 AM

## 2022-04-17 NOTE — Op Note (Addendum)
[facility] Patient Name: Kristi Clay Procedure Date: 04/17/2022 11:29 AM MRN: [id number] Endoscopist: Adline Mango [place] , , [id number] Age: 67 Referring MD:  Date of Birth: June 30, 1955 Gender: Female Account #: [id number] Procedure:                Upper GI endoscopy Indications:              Dysphagia, Heartburn Medicines:                Monitored Anesthesia Care Procedure:                Pre-Anesthesia Assessment:                           - Prior to the procedure, a History and Physical                            was performed, and patient medications and                            allergies were reviewed. The patient's tolerance of                            previous anesthesia was also reviewed. The risks                            and benefits of the procedure and the sedation                            options and risks were discussed with the patient.                            All questions were answered, and informed consent                            was obtained. Prior Anticoagulants: The patient has                            taken no anticoagulant or antiplatelet agents. ASA                            Grade Assessment: II - A patient with mild systemic                            disease. After reviewing the risks and benefits,                            the patient was deemed in satisfactory condition to                            undergo the procedure.                           After obtaining informed consent, the endoscope was  passed under direct vision. Throughout the                            procedure, the patient's blood pressure, pulse, and                            oxygen saturations were monitored continuously. The                            Endoscope was introduced through the mouth, and                            advanced to the duodenal bulb. The upper GI                            endoscopy was accomplished without  difficulty. The                            patient tolerated the procedure well. Scope In: Scope Out: Findings:                 One benign-appearing, intrinsic moderate                            (circumferential scarring or stenosis; an endoscope                            may pass) stenosis was found at the                            gastroesophageal junction. This stenosis measured                            less than one cm (in length). The stenosis was                            traversed. A TTS dilator was passed through the                            scope. Dilation with a 12-13.5-15 mm balloon                            dilator was performed to 15 mm. The dilation site                            was examined and showed mild mucosal disruption.                            Biopsies were taken with a cold forceps for                            histology.                           Biopsies were taken with  a cold forceps in the                            proximal esophagus and in the distal esophagus for                            histology.                           A 2 cm hiatal hernia was present.                           Localized severe inflammation characterized by                            congestion (edema), erosions, erythema, granularity                            and serpentine ulcerations was found in the gastric                            body and in the gastric antrum. Biopsies were taken                            with a cold forceps for histology.                           An acquired benign-appearing, intrinsic severe                            stenosis was found in the duodenal bulb and was                            non-traversed because the endoscope could not pass                            after several attempts. Biopsies were taken with a                            cold forceps for histology.                           Two non-bleeding cratered duodenal ulcers  were                            found in the duodenal bulb. Complications:            No immediate complications. Estimated Blood Loss:     Estimated blood loss was minimal. Impression:               - Benign-appearing esophageal stenosis. Dilated.                            Biopsied.                           -  2 cm hiatal hernia.                           - Gastritis. Biopsied.                           - Acquired duodenal stenosis. Biopsied.                           - Non-bleeding duodenal ulcers.                           - Biopsies were taken with a cold forceps for                            histology in the proximal esophagus and in the                            distal esophagus. Recommendation:           - Discharge patient to home (with escort).                           - Await pathology results.                           - Use Prilosec (omeprazole) 40 mg PO BID for 12                            weeks.                           - No ibuprofen, naproxen, or other non-steroidal                            anti-inflammatory drugs.                           - Repeat upper endoscopy at the hospital in 2                            months to check healing and for potential treatment                            of the duodenal stenosis.                           - The findings and recommendations were discussed                            with the patient. Dr Georgian Co "[name]" Lorenso Courier,  04/17/2022 12:00:37 PM

## 2022-04-17 NOTE — Telephone Encounter (Signed)
Patient contacted and scheduled.

## 2022-04-17 NOTE — Patient Instructions (Addendum)
  Use Prilosec (omeprazole) 40 mg PO BID for 12 weeks. - No ibuprofen, naproxen, or other non-steroidal anti-inflammatory drugs. - Repeat upper endoscopy at the hospital in 2 months to check healing and for potential treatment of the duodenal stenosis. - The findings and recommendations were discussed with the patient.   YOU HAD AN ENDOSCOPIC PROCEDURE TODAY AT [facility]:   Refer to the procedure report that was given to you for any specific questions about what was found during the examination.  If the procedure report does not answer your questions, please call your gastroenterologist to clarify.  If you requested that your care partner not be given the details of your procedure findings, then the procedure report has been included in a sealed envelope for you to review at your convenience later.  YOU SHOULD EXPECT: Some feelings of bloating in the abdomen. Passage of more gas than usual.  Walking can help get rid of the air that was put into your GI tract during the procedure and reduce the bloating. If you had a lower endoscopy (such as a colonoscopy or flexible sigmoidoscopy) you may notice spotting of blood in your stool or on the toilet paper. If you underwent a bowel prep for your procedure, you may not have a normal bowel movement for a few days.  Please Note:  You might notice some irritation and congestion in your nose or some drainage.  This is from the oxygen used during your procedure.  There is no need for concern and it should clear up in a day or so.  SYMPTOMS TO REPORT IMMEDIATELY:   Following upper endoscopy (EGD)  Vomiting of blood or coffee ground material  New chest pain or pain under the shoulder blades  Painful or persistently difficult swallowing  New shortness of breath  Fever of 100F or higher  Black, tarry-looking stools  For urgent or emergent issues, a gastroenterologist can be reached at any hour by calling [phone]. Do not use MyChart  messaging for urgent concerns.    DIET:  We do recommend a small meal at first, but then you may proceed to your regular diet.  Drink plenty of fluids but you should avoid alcoholic beverages for 24 hours.  ACTIVITY:  You should plan to take it easy for the rest of today and you should NOT DRIVE or use heavy machinery until tomorrow (because of the sedation medicines used during the test).    FOLLOW UP: Our staff will call the number listed on your records the next business day following your procedure.  We will call around 7:15- 8:00 am to check on you and address any questions or concerns that you may have regarding the information given to you following your procedure. If we do not reach you, we will leave a message.     If any biopsies were taken you will be contacted by phone or by letter within the next 1-3 weeks.  Please call us at [phone] if you have not heard about the biopsies in 3 weeks.    SIGNATURES/CONFIDENTIALITY: You and/or your care partner have signed paperwork which will be entered into your electronic medical record.  These signatures attest to the fact that that the information above on your After Visit Summary has been reviewed and is understood.  Full responsibility of the confidentiality of this discharge information lies with you and/or your care-partner.

## 2022-04-17 NOTE — Telephone Encounter (Signed)
-----   Message from Sharyn Creamer, MD sent at 04/17/2022 11:58 AM EST ----- Hi Kristi Clay, let's get this patient scheduled for an EGD at the hospital in one of my 05/2022 [facility] dates for follow up gastric ulcer, duodenal stenosis, esophogeal stenosis. Thanks.

## 2022-04-17 NOTE — Progress Notes (Signed)
Report to pacu rn. Vss. Care resumed by rn. 

## 2022-04-17 NOTE — Progress Notes (Signed)
Pt's states no medical or surgical changes since previsit or office visit. VS assessed by D.T 

## 2022-04-18 ENCOUNTER — Telehealth: Payer: Self-pay

## 2022-04-18 NOTE — Telephone Encounter (Signed)
Follow up call to pt, no answer. 

## 2022-04-18 NOTE — Telephone Encounter (Addendum)
Spoke with the patient. Agrees to having her EGD at the [facility] 06/05/22. Instructions reviewed. Written instructions mailed.

## 2022-04-18 NOTE — Telephone Encounter (Signed)
-----   Message from Sharyn Creamer, MD sent at 04/17/2022 11:58 AM EST ----- Hi Beth, let's get this patient scheduled for an EGD at the hospital in one of my 05/2022 [facility] dates for follow up gastric ulcer, duodenal stenosis, esophogeal stenosis. Thanks.

## 2022-04-23 ENCOUNTER — Encounter: Payer: Self-pay | Admitting: Internal Medicine

## 2022-05-29 ENCOUNTER — Encounter (HOSPITAL_COMMUNITY): Payer: Self-pay | Admitting: Internal Medicine

## 2022-05-29 NOTE — Progress Notes (Signed)
Attempted to obtain medical history via telephone, unable to reach at this time. HIPAA compliant voicemail message left requesting return call to pre surgical testing department. 

## 2022-06-05 ENCOUNTER — Encounter (HOSPITAL_COMMUNITY): Payer: Self-pay | Admitting: Internal Medicine

## 2022-06-05 ENCOUNTER — Ambulatory Visit (HOSPITAL_COMMUNITY): Payer: PPO | Admitting: Certified Registered Nurse Anesthetist

## 2022-06-05 ENCOUNTER — Encounter (HOSPITAL_COMMUNITY)
Admission: RE | Disposition: A | Discharge status: Home / Self Care | Payer: Self-pay | Source: Home / Self Care | Attending: Internal Medicine

## 2022-06-05 ENCOUNTER — Ambulatory Visit (HOSPITAL_BASED_OUTPATIENT_CLINIC_OR_DEPARTMENT_OTHER): Payer: PPO | Admitting: Certified Registered Nurse Anesthetist

## 2022-06-05 ENCOUNTER — Ambulatory Visit (HOSPITAL_COMMUNITY)
Admission: RE | Admit: 2022-06-05 | Discharge: 2022-06-05 | Disposition: A | Discharge status: Home / Self Care | Payer: PPO | Attending: Internal Medicine | Admitting: Internal Medicine

## 2022-06-05 ENCOUNTER — Other Ambulatory Visit: Payer: Self-pay

## 2022-06-05 DIAGNOSIS — K449 Diaphragmatic hernia without obstruction or gangrene: Secondary | ICD-10-CM | POA: Diagnosis not present

## 2022-06-05 DIAGNOSIS — K222 Esophageal obstruction: Secondary | ICD-10-CM | POA: Insufficient documentation

## 2022-06-05 DIAGNOSIS — K219 Gastro-esophageal reflux disease without esophagitis: Secondary | ICD-10-CM | POA: Diagnosis not present

## 2022-06-05 DIAGNOSIS — K253 Acute gastric ulcer without hemorrhage or perforation: Secondary | ICD-10-CM

## 2022-06-05 DIAGNOSIS — K311 Adult hypertrophic pyloric stenosis: Secondary | ICD-10-CM

## 2022-06-05 DIAGNOSIS — Z79899 Other long term (current) drug therapy: Secondary | ICD-10-CM | POA: Insufficient documentation

## 2022-06-05 DIAGNOSIS — F32A Depression, unspecified: Secondary | ICD-10-CM | POA: Insufficient documentation

## 2022-06-05 DIAGNOSIS — F419 Anxiety disorder, unspecified: Secondary | ICD-10-CM | POA: Insufficient documentation

## 2022-06-05 DIAGNOSIS — K315 Obstruction of duodenum: Secondary | ICD-10-CM | POA: Insufficient documentation

## 2022-06-05 DIAGNOSIS — R1310 Dysphagia, unspecified: Secondary | ICD-10-CM | POA: Insufficient documentation

## 2022-06-05 DIAGNOSIS — Z8711 Personal history of peptic ulcer disease: Secondary | ICD-10-CM | POA: Diagnosis not present

## 2022-06-05 DIAGNOSIS — E039 Hypothyroidism, unspecified: Secondary | ICD-10-CM | POA: Diagnosis not present

## 2022-06-05 DIAGNOSIS — I10 Essential (primary) hypertension: Secondary | ICD-10-CM | POA: Insufficient documentation

## 2022-06-05 HISTORY — PX: ESOPHAGOGASTRODUODENOSCOPY (EGD) WITH PROPOFOL: SHX5813

## 2022-06-05 HISTORY — PX: BALLOON DILATION: SHX5330

## 2022-06-05 SURGERY — ESOPHAGOGASTRODUODENOSCOPY (EGD) WITH PROPOFOL
Anesthesia: Monitor Anesthesia Care

## 2022-06-05 MED ORDER — PROPOFOL 10 MG/ML IV BOLUS
INTRAVENOUS | Status: AC
  Filled 2022-06-05: qty 20

## 2022-06-05 MED ORDER — LACTATED RINGERS IV SOLN
INTRAVENOUS | Status: AC | PRN
  Administered 2022-06-05: 1000 mL via INTRAUTERINE

## 2022-06-05 MED ORDER — OMEPRAZOLE 40 MG PO CPDR: 40.0000 mg | DELAYED_RELEASE_CAPSULE | Freq: Two times a day (BID) | ORAL | 2 refills | Status: AC

## 2022-06-05 MED ORDER — PROPOFOL 1000 MG/100ML IV EMUL
INTRAVENOUS | Status: AC
  Filled 2022-06-05: qty 100

## 2022-06-05 MED ORDER — PROPOFOL 500 MG/50ML IV EMUL
INTRAVENOUS | Status: DC | PRN
  Administered 2022-06-05: 125 ug/kg/min via INTRAVENOUS

## 2022-06-05 MED ORDER — FENTANYL CITRATE (PF) 100 MCG/2ML IJ SOLN
INTRAMUSCULAR | Status: DC | PRN
  Administered 2022-06-05: 80 ug via INTRAVENOUS

## 2022-06-05 MED ORDER — PROPOFOL 10 MG/ML IV BOLUS
INTRAVENOUS | Status: DC | PRN
  Administered 2022-06-05 (×2): 30 mg via INTRAVENOUS
  Administered 2022-06-05: 20 mg via INTRAVENOUS
  Administered 2022-06-05: 30 mg via INTRAVENOUS
  Administered 2022-06-05: 20 mg via INTRAVENOUS
  Administered 2022-06-05: 30 mg via INTRAVENOUS

## 2022-06-05 SURGICAL SUPPLY — 15 items

## 2022-06-05 NOTE — Anesthesia Postprocedure Evaluation (Signed)
Anesthesia Post Note  Patient: Kristi Clay  Procedure(s) Performed: ESOPHAGOGASTRODUODENOSCOPY (EGD) WITH PROPOFOL BALLOON DILATION     Patient location during evaluation: PACU Anesthesia Type: MAC Level of consciousness: awake and alert Pain management: pain level controlled Vital Signs Assessment: post-procedure vital signs reviewed and stable Respiratory status: spontaneous breathing, nonlabored ventilation and respiratory function stable Cardiovascular status: blood pressure returned to baseline Postop Assessment: no apparent nausea or vomiting Anesthetic complications: no   No notable events documented.  Last Vitals:  Vitals:   06/05/22 1545 06/05/22 1550  BP: (!) 160/130 (!) 145/78  Pulse: 65   Resp: 20 20  Temp:    SpO2: 99%     Last Pain:  Vitals:   06/05/22 1531  TempSrc:   PainSc: 0-No pain                 Marthenia Rolling

## 2022-06-05 NOTE — Transfer of Care (Signed)
Immediate Anesthesia Transfer of Care Note  Patient: Kristi Clay  Procedure(s) Performed: ESOPHAGOGASTRODUODENOSCOPY (EGD) WITH PROPOFOL BALLOON DILATION  Patient Location: Endoscopy Unit  Anesthesia Type:MAC  Level of Consciousness: drowsy and patient cooperative  Airway & Oxygen Therapy: Patient Spontanous Breathing and Patient connected to face mask oxygen  Post-op Assessment: Report given to RN and Post -op Vital signs reviewed and stable  Post vital signs: Reviewed and stable  Last Vitals:  Vitals Value Taken Time  BP 123/65 06/05/22 1532  Temp    Pulse 69 06/05/22 1532  Resp 23 06/05/22 1532  SpO2 99 % 06/05/22 1532    Last Pain:  Vitals:   06/05/22 1531  TempSrc:   PainSc: 0-No pain         Complications: No notable events documented.

## 2022-06-05 NOTE — Discharge Instructions (Signed)
YOU HAD AN ENDOSCOPIC PROCEDURE TODAY: Refer to the procedure report and other information in the discharge instructions given to you for any specific questions about what was found during the examination. If this information does not answer your questions, please call [facility] office at [phone] to clarify.  ° °YOU SHOULD EXPECT: Some feelings of bloating in the abdomen. Passage of more gas than usual. Walking can help get rid of the air that was put into your GI tract during the procedure and reduce the bloating. If you had a lower endoscopy (such as a colonoscopy or flexible sigmoidoscopy) you may notice spotting of blood in your stool or on the toilet paper. Some abdominal soreness may be present for a day or two, also. ° °DIET: Your first meal following the procedure should be a light meal and then it is ok to progress to your normal diet. A half-sandwich or bowl of soup is an example of a good first meal. Heavy or fried foods are harder to digest and may make you feel nauseous or bloated. Drink plenty of fluids but you should avoid alcoholic beverages for 24 hours. If you had a esophageal dilation, please see attached instructions for diet.   ° °ACTIVITY: Your care partner should take you home directly after the procedure. You should plan to take it easy, moving slowly for the rest of the day. You can resume normal activity the day after the procedure however YOU SHOULD NOT DRIVE, use power tools, machinery or perform tasks that involve climbing or major physical exertion for 24 hours (because of the sedation medicines used during the test).  ° °SYMPTOMS TO REPORT IMMEDIATELY: °A gastroenterologist can be reached at any hour. Please call [phone]  for any of the following symptoms:  °Following lower endoscopy (colonoscopy, flexible sigmoidoscopy) °Excessive amounts of blood in the stool  °Significant tenderness, worsening of abdominal pains  °Swelling of the abdomen that is new, acute  °Fever of 100° or  higher  °Following upper endoscopy (EGD, EUS, ERCP, esophageal dilation) °Vomiting of blood or coffee ground material  °New, significant abdominal pain  °New, significant chest pain or pain under the shoulder blades  °Painful or persistently difficult swallowing  °New shortness of breath  °Black, tarry-looking or red, bloody stools ° °FOLLOW UP:  °If any biopsies were taken you will be contacted by phone or by letter within the next 1-3 weeks. Call [phone]  if you have not heard about the biopsies in 3 weeks.  °Please also call with any specific questions about appointments or follow up tests. ° °

## 2022-06-05 NOTE — H&P (Signed)
GASTROENTEROLOGY PROCEDURE H&P NOTE   Primary Care Physician: Deland Pretty, MD    Reason for Procedure:   Dysphagia, duodenal stenosis, esophageal stenosis, follow up of gastric ulcers  Plan:    EGD  Patient is appropriate for endoscopic procedure(s) in the hospital setting.  The nature of the procedure, as well as the risks, benefits, and alternatives were carefully and thoroughly reviewed with the patient. Ample time for discussion and questions allowed. The patient understood, was satisfied, and agreed to proceed.     HPI: Kristi Clay is a 67 y.o. female who presents for EGD for dysphagia, duodenal stenosis, esophageal stenosis, and follow up of gastric ulcers. She has been compliant with her omeprazole therapy.  Past Medical History:  Diagnosis Date   Depression    Hyperlipemia    Hypertension    LBBB (left bundle branch block)    Reaction to chronic stress     Past Surgical History:  Procedure Laterality Date   NASAL SEPTUM SURGERY     ORIF FIBULA FRACTURE Right 02/05/2021   Procedure: OPEN TREATMENT OF RIGHT INTRA-ARTICULAR DISTAL TIBIA FRACTURE WITH FIBULAR FIXATION,;  Surgeon: Erle Crocker, MD;  Location: [facility];  Service: Orthopedics;  Laterality: Right;  LENGTH OF SURGERY: 120 MINUTES    Prior to Admission medications   Medication Sig Start Date End Date Taking? Authorizing Provider  atorvastatin (LIPITOR) 40 MG tablet TAKE 1 TABLET BY MOUTH EVERY DAY 05/20/21  Yes Donato Heinz, MD  carvedilol (COREG) 6.25 MG tablet TAKE 1 TABLET BY MOUTH 2 (TWO) TIMES DAILY WITH A MEAL. SCHEDULE OFFICE VISIT FOR FUTURE REFILLS. 12/09/21  Yes Donato Heinz, MD  clonazePAM (KLONOPIN) 0.5 MG tablet Take 0.5 mg by mouth daily as needed for anxiety.   Yes [provider]  COENZYME Q10-RED YEAST RICE PO Take 1 tablet by mouth daily.   Yes [provider]  omeprazole (PRILOSEC) 40 MG capsule Take 1 capsule (40 mg  total) by mouth daily. Patient taking differently: Take 40 mg by mouth 2 (two) times daily before a meal. 04/17/22  Yes Sharyn Creamer, MD  metoprolol tartrate (LOPRESSOR) 100 MG tablet Take 100 mg (1 tablet) 2 HOURS PRIOR TO CT-HOLD COREG Patient not taking: Reported on 04/16/2022 07/06/19   Donato Heinz, MD  nitroGLYCERIN (NITROSTAT) 0.4 MG SL tablet Place 1 tablet (0.4 mg total) under the tongue every 5 (five) minutes as needed for chest pain. 07/06/19 10/04/19  Donato Heinz, MD    No current facility-administered medications for this encounter.    Allergies as of 04/17/2022   (No Known Allergies)    Family History  Problem Relation Age of Onset   Colon polyps Mother    Heart disease Mother    Diabetes Sister    Heart disease Maternal Grandmother    Heart disease Maternal Grandfather    Heart disease Paternal Grandmother    Heart disease Paternal Grandfather    Colon cancer Neg Hx    Rectal cancer Neg Hx    Stomach cancer Neg Hx     Social History   Socioeconomic History   Marital status: Widowed    Spouse name: Not on file   Number of children: 3   Years of education: Not on file   Highest education level: Not on file  Occupational History   Occupation: CMA  Tobacco Use   Smoking status: Never   Smokeless tobacco: Never  Vaping Use   Vaping Use: Never  used  Substance and Sexual Activity   Alcohol use: Yes    Comment: occ   Drug use: No   Sexual activity: Not on file  Other Topics Concern   Not on file  Social History Narrative   Not on file   Social Determinants of Health   Financial Resource Strain: Not on file  Food Insecurity: Not on file  Transportation Needs: Not on file  Physical Activity: Not on file  Stress: Not on file  Social Connections: Not on file  Intimate Partner Violence: Not on file    Physical Exam: Vital signs in last 24 hours: BP (!) 166/56   Pulse 69   Temp 97.8 F (36.6 C) (Tympanic)   Resp 15   Ht 5' 3"$   (1.6 m)   Wt 75.8 kg   SpO2 94%   BMI 29.60 kg/m  GEN: NAD EYE: Sclerae anicteric ENT: MMM CV: Non-tachycardic Pulm: No increased work of breathing GI: Soft, NT/ND NEURO:  Alert & Oriented   Christia Reading, MD [facility]  06/05/2022 2:32 PM

## 2022-06-05 NOTE — Op Note (Signed)
Kristi Clay Patient Name: Kristi Clay Procedure Date: 06/05/2022 MRN: [id number] Attending MD: Georgian Co , , [id number] Date of Birth: Feb 11, 1956 CSN: [id number] Age: 67 Admit Type: Outpatient Procedure:                Upper GI endoscopy Indications:              Dysphagia, Follow-up of esophageal stenosis,                            Follow-up of pyloric stenosis, Follow-up of acute                            gastric ulcer Providers:                Adline Mango" Michaelle Birks, RN, Luan Moore, Technician, Adair Laundry, CRNA Referring MD:             Deland Pretty Medicines:                Monitored Anesthesia Care Complications:            No immediate complications. Estimated Blood Loss:     Estimated blood loss was minimal. Procedure:                Pre-Anesthesia Assessment:                           - Prior to the procedure, a History and Physical                            was performed, and patient medications and                            allergies were reviewed. The patient's tolerance of                            previous anesthesia was also reviewed. The risks                            and benefits of the procedure and the sedation                            options and risks were discussed with the patient.                            All questions were answered, and informed consent                            was obtained. Prior Anticoagulants: The patient has                            taken no anticoagulant or antiplatelet agents. ASA  Grade Assessment: II - A patient with mild systemic                            disease. After reviewing the risks and benefits,                            the patient was deemed in satisfactory condition to                            undergo the procedure.                           After obtaining informed consent, the endoscope was                             passed under direct vision. Throughout the                            procedure, the patient's blood pressure, pulse, and                            oxygen saturations were monitored continuously. The                            [id number] Olympus endoscope was introduced                            through the mouth, and advanced to the second part                            of duodenum. The upper GI endoscopy was                            accomplished without difficulty. The patient                            tolerated the procedure well. Scope In: Scope Out: Findings:      One benign-appearing, intrinsic moderate (circumferential scarring or       stenosis; an endoscope may pass) stenosis was found at the       gastroesophageal junction. This stenosis measured less than one cm (in       length). The stenosis was traversed. A TTS dilator was passed through       the scope. Dilation with a 16-17-18 mm balloon dilator was performed to       18 mm.      A 2 cm hiatal hernia was present.      An acquired benign-appearing, intrinsic stenosis was found in the       duodenal bulb and was traversed after dilation. Visualization of the       rest of the duodenal bulb and 2nd portion of the duodenum was       accomplished by using a neonatal endoscope. A TTS dilator was passed       through the regular upper endoscope. Dilation with a 12 mm pyloric  balloon dilator was performed. Impression:               - Benign-appearing esophageal stenosis. Dilated.                           - 2 cm hiatal hernia.                           - Acquired duodenal stenosis. Dilated.                           - No specimens collected. Moderate Sedation:      Not Applicable - Patient had care per Anesthesia. Recommendation:           - Discharge patient to home (with escort).                           - Use Prilosec (omeprazole) 40 mg PO BID for 4                            weeks.                            - Return to GI clinic in 4 weeks.                           - The findings and recommendations were discussed                            with the patient. Procedure Code(s):        --- Professional ---                           [phone], Esophagogastroduodenoscopy, flexible,                            transoral; with dilation of gastric/duodenal                            stricture(s) (eg, balloon, bougie)                           43249, Esophagogastroduodenoscopy, flexible,                            transoral; with transendoscopic balloon dilation of                            esophagus (less than 30 mm diameter) Diagnosis Code(s):        --- Professional ---                           K22.2, Esophageal obstruction                           K44.9, Diaphragmatic hernia without obstruction or  gangrene                           K31.5, Obstruction of duodenum                           R13.10, Dysphagia, unspecified                           K31.1, Adult hypertrophic pyloric stenosis                           K25.3, Acute gastric ulcer without hemorrhage or                            perforation CPT copyright 2022 [employer]. All rights reserved. The codes documented in this report are preliminary and upon coder review may  be revised to meet current compliance requirements. Dr Georgian Co "[name]" [place],  06/05/2022 3:29:32 PM Number of Addenda: 0

## 2022-06-05 NOTE — Anesthesia Preprocedure Evaluation (Addendum)
Anesthesia Evaluation  Patient identified by MRN, date of birth, ID band Patient awake    Reviewed: Allergy & Precautions, NPO status , Patient's Chart, lab work & pertinent test results, reviewed documented beta blocker date and time   History of Anesthesia Complications Negative for: history of anesthetic complications  Airway Mallampati: II  TM Distance: >3 FB Neck ROM: Full  Mouth opening: Limited Mouth Opening  Dental no notable dental hx.    Pulmonary neg pulmonary ROS   Pulmonary exam normal        Cardiovascular hypertension, Pt. on medications and Pt. on home beta blockers Normal cardiovascular exam  LBBB   Neuro/Psych  Headaches  Anxiety Depression       GI/Hepatic Neg liver ROS,GERD  Medicated,,gastric ulcer, duodenal stenosis, esophogeal stenosis   Endo/Other  Hypothyroidism    Renal/GU negative Renal ROS  negative genitourinary   Musculoskeletal negative musculoskeletal ROS (+)    Abdominal   Peds  Hematology negative hematology ROS (+)   Anesthesia Other Findings Day of surgery medications reviewed with patient.  Reproductive/Obstetrics negative OB ROS                             Anesthesia Physical Anesthesia Plan  ASA: 2  Anesthesia Plan: MAC   Post-op Pain Management: Minimal or no pain anticipated   Induction:   PONV Risk Score and Plan: 2 and Treatment may vary due to age or medical condition and Propofol infusion  Airway Management Planned: Natural Airway and Nasal Cannula  Additional Equipment: None  Intra-op Plan:   Post-operative Plan:   Informed Consent: I have reviewed the patients History and Physical, chart, labs and discussed the procedure including the risks, benefits and alternatives for the proposed anesthesia with the patient or authorized representative who has indicated his/her understanding and acceptance.       Plan Discussed with:  CRNA  Anesthesia Plan Comments:        Anesthesia Quick Evaluation

## 2022-06-08 ENCOUNTER — Encounter (HOSPITAL_COMMUNITY): Payer: Self-pay | Admitting: Internal Medicine

## 2022-12-31 DIAGNOSIS — E559 Vitamin D deficiency, unspecified: Secondary | ICD-10-CM | POA: Diagnosis not present

## 2022-12-31 DIAGNOSIS — R5383 Other fatigue: Secondary | ICD-10-CM | POA: Diagnosis not present

## 2022-12-31 DIAGNOSIS — Z0000 Encounter for general adult medical examination without abnormal findings: Secondary | ICD-10-CM | POA: Diagnosis not present

## 2022-12-31 DIAGNOSIS — I10 Essential (primary) hypertension: Secondary | ICD-10-CM | POA: Diagnosis not present

## 2022-12-31 DIAGNOSIS — E7800 Pure hypercholesterolemia, unspecified: Secondary | ICD-10-CM | POA: Diagnosis not present

## 2022-12-31 DIAGNOSIS — R7989 Other specified abnormal findings of blood chemistry: Secondary | ICD-10-CM | POA: Diagnosis not present

## 2023-01-14 DIAGNOSIS — F419 Anxiety disorder, unspecified: Secondary | ICD-10-CM | POA: Diagnosis not present

## 2023-01-14 DIAGNOSIS — I10 Essential (primary) hypertension: Secondary | ICD-10-CM | POA: Diagnosis not present

## 2023-01-14 DIAGNOSIS — Z0000 Encounter for general adult medical examination without abnormal findings: Secondary | ICD-10-CM | POA: Diagnosis not present

## 2023-01-14 DIAGNOSIS — E559 Vitamin D deficiency, unspecified: Secondary | ICD-10-CM | POA: Diagnosis not present

## 2023-01-14 DIAGNOSIS — K760 Fatty (change of) liver, not elsewhere classified: Secondary | ICD-10-CM | POA: Diagnosis not present

## 2023-06-15 IMAGING — CT CT ANKLE*R* W/O CM
1 series · 12 of 14 positions shown, 15 images · non-contrast
Comparison: Ankle radiograph 01/20/2021

CLINICAL DATA: Preop trimalleolar fracture

EXAM:
CT OF THE RIGHT ANKLE WITHOUT CONTRAST
TECHNIQUE: Multidetector CT imaging of the right ankle was performed according
to the standard protocol. Multiplanar CT image reconstructions were
also generated.

[Series 4: soft tissue lower extremity · axial · 0.32mm/px · z∈[-296,-128]mm · 12 of 100 slices shown, 15 images]
[im 8/100  soft-tissue]
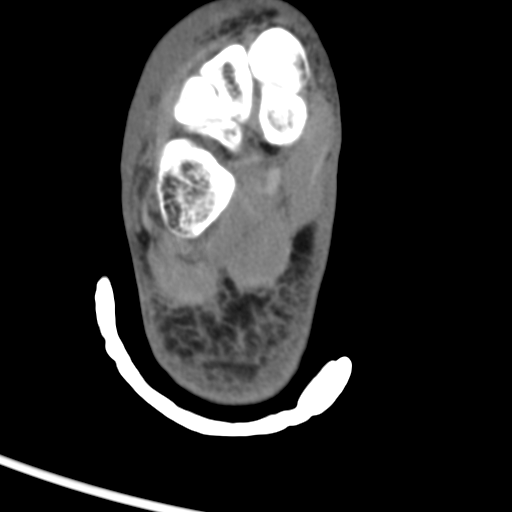
[im 8/100  bone]
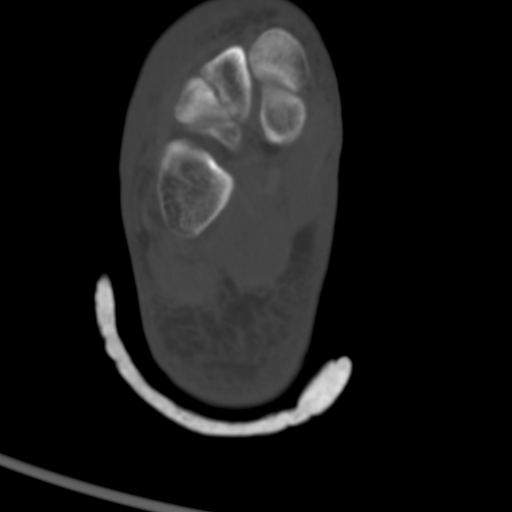
[im 16/100  bone]
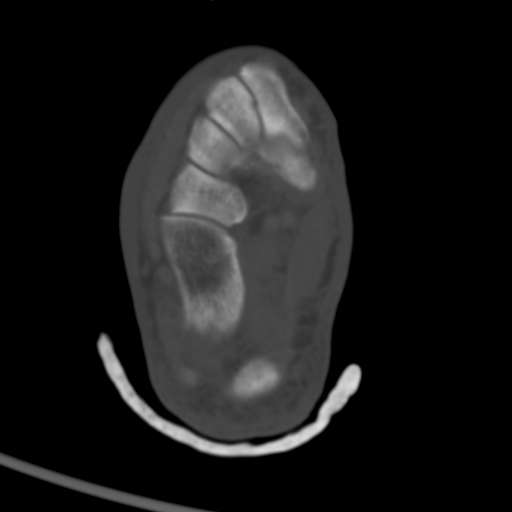
[im 23/100  bone]
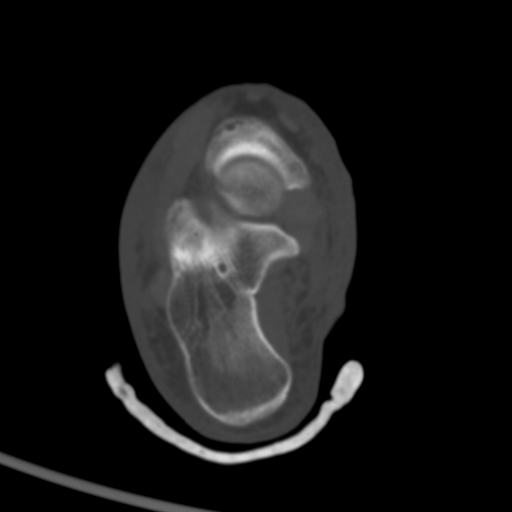
[im 31/100  bone]
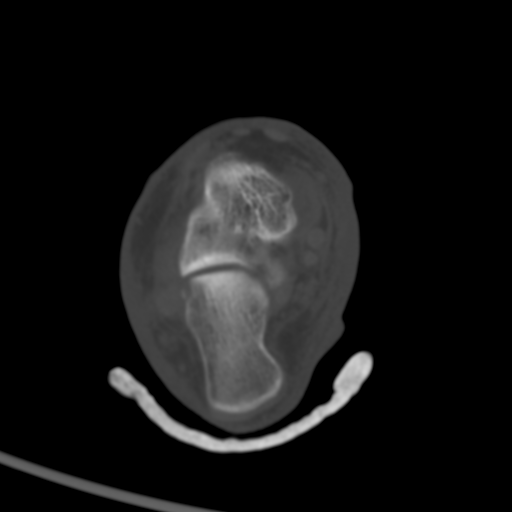
[im 39/100  soft-tissue]
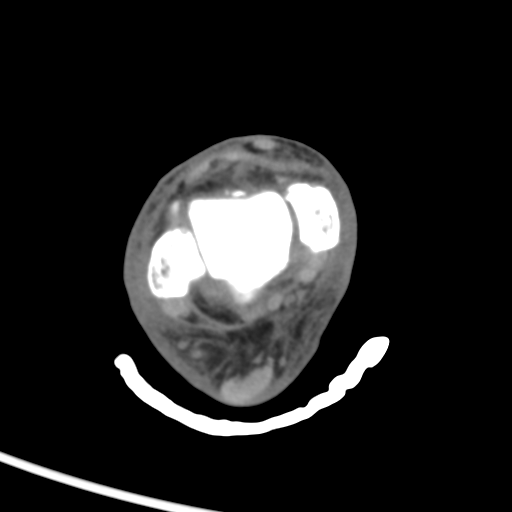
[im 39/100  bone]
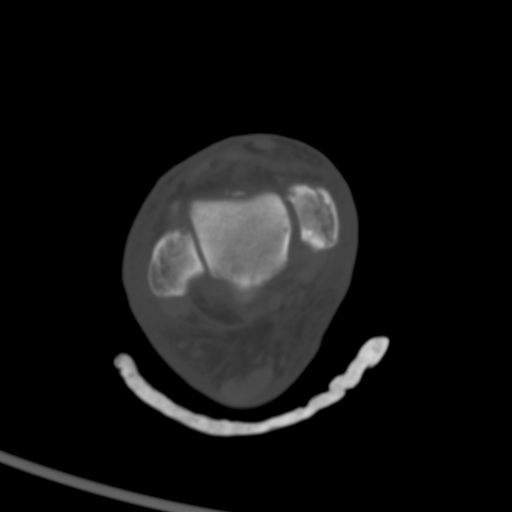
[im 46/100  bone]
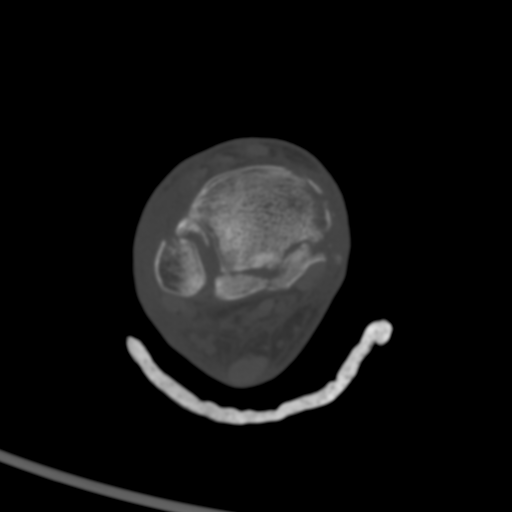
[im 54/100  bone]
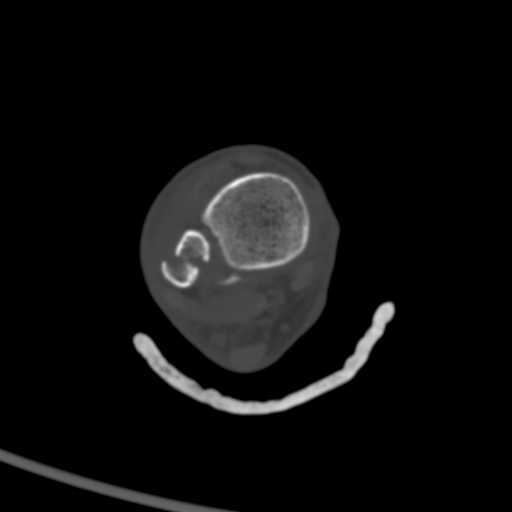
[im 61/100  bone]
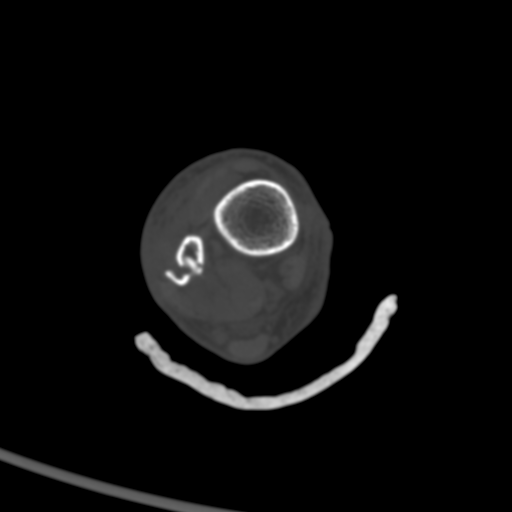
[im 69/100  soft-tissue]
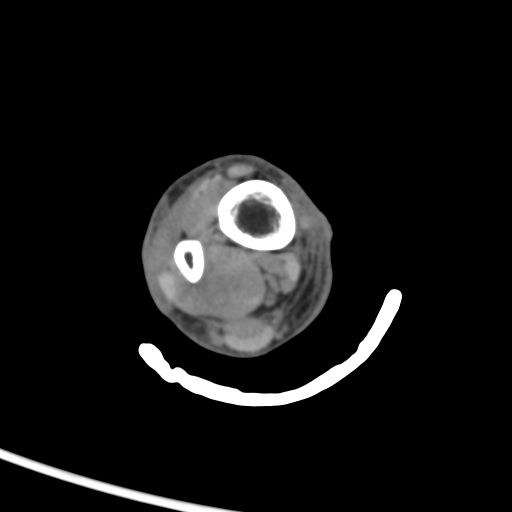
[im 69/100  bone]
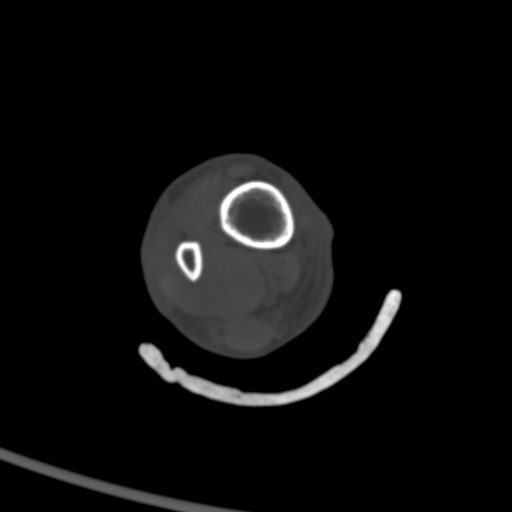
[im 77/100  bone]
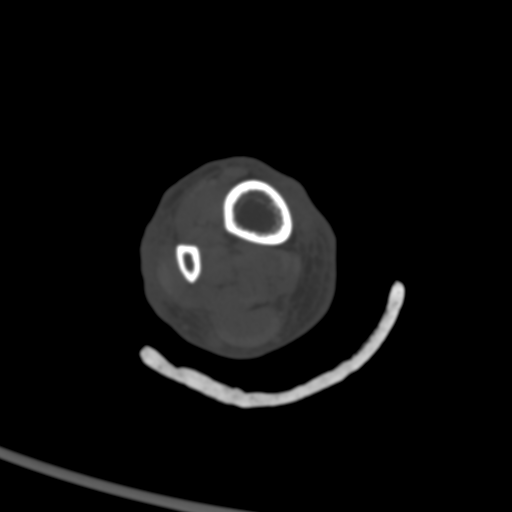
[im 84/100  bone]
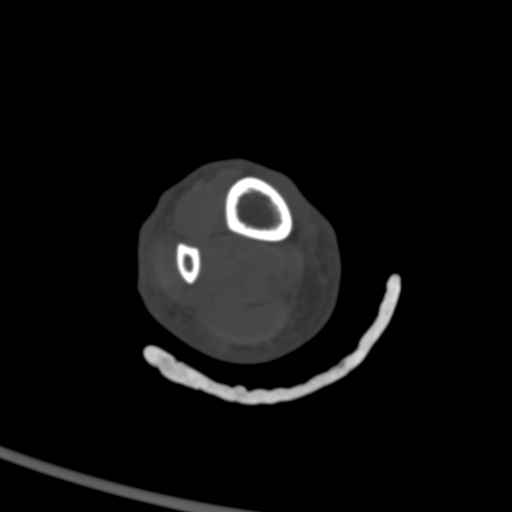
[im 92/100  bone]
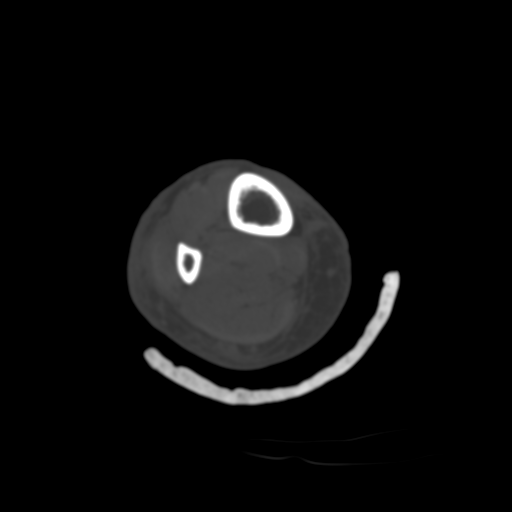

[12 of 14 positions shown; findings below may reference images not displayed]

FINDINGS: Bones/Joint/Cartilage

There is a trimalleolar fracture of the ankle, with improved
alignment after initial reduction and splinting. Comminuted medial
malleolar fracture demonstrates up to 5 mm displacement. Oblique
distal fibular fracture, [place] type B, demonstrates mild
posterolateral displacement up to 4 mm. There is a posterior
malleolar fracture with mild displacement up to 2 mm and involving
approximately 20% of the articular surface (sagittal image 23).

Ligaments

Suboptimally assessed by CT.

Muscles and Tendons

No evidence of tendon entrapment.

Soft tissues

Extensive soft tissue swelling.  No focal fluid collection.
IMPRESSION: Trimalleolar ankle fracture as described above. The posterior
malleolar fracture involves approximately 20% of the articular
surface. No evidence of tendon entrapment.

## 2024-01-11 DIAGNOSIS — E559 Vitamin D deficiency, unspecified: Secondary | ICD-10-CM | POA: Diagnosis not present

## 2024-01-11 DIAGNOSIS — E7800 Pure hypercholesterolemia, unspecified: Secondary | ICD-10-CM | POA: Diagnosis not present

## 2024-01-18 DIAGNOSIS — E559 Vitamin D deficiency, unspecified: Secondary | ICD-10-CM | POA: Diagnosis not present

## 2024-01-18 DIAGNOSIS — I10 Essential (primary) hypertension: Secondary | ICD-10-CM | POA: Diagnosis not present

## 2024-01-18 DIAGNOSIS — Z0000 Encounter for general adult medical examination without abnormal findings: Secondary | ICD-10-CM | POA: Diagnosis not present

## 2024-01-18 DIAGNOSIS — E7800 Pure hypercholesterolemia, unspecified: Secondary | ICD-10-CM | POA: Diagnosis not present

## 2024-01-18 DIAGNOSIS — J069 Acute upper respiratory infection, unspecified: Secondary | ICD-10-CM | POA: Diagnosis not present

## 2024-01-18 DIAGNOSIS — R0683 Snoring: Secondary | ICD-10-CM | POA: Diagnosis not present

## 2024-02-02 DIAGNOSIS — J0110 Acute frontal sinusitis, unspecified: Secondary | ICD-10-CM | POA: Diagnosis not present

## 2024-02-02 DIAGNOSIS — I10 Essential (primary) hypertension: Secondary | ICD-10-CM | POA: Diagnosis not present
